# Patient Record
Sex: Male | Born: 1965 | Race: Black or African American | Hispanic: No | Marital: Single | Smoking: Current every day smoker
Health system: Southern US, Community
[De-identification: ages and names within clinical notes are randomized; demographics above are authoritative.]

## PROBLEM LIST (undated history)

## (undated) ENCOUNTER — Emergency Department: Admission: EM | Payer: Medicaid Other | Source: Home / Self Care

## (undated) ENCOUNTER — Emergency Department: Payer: Medicaid Other

## (undated) DIAGNOSIS — F1010 Alcohol abuse, uncomplicated: Secondary | ICD-10-CM

## (undated) DIAGNOSIS — E119 Type 2 diabetes mellitus without complications: Secondary | ICD-10-CM

## (undated) DIAGNOSIS — K8020 Calculus of gallbladder without cholecystitis without obstruction: Secondary | ICD-10-CM

## (undated) DIAGNOSIS — Z720 Tobacco use: Secondary | ICD-10-CM

## (undated) DIAGNOSIS — K3532 Acute appendicitis with perforation and localized peritonitis, without abscess: Secondary | ICD-10-CM

## (undated) HISTORY — DX: Alcohol abuse, uncomplicated: F10.10

## (undated) HISTORY — PX: APPENDECTOMY: SHX54

## (undated) HISTORY — DX: Calculus of gallbladder without cholecystitis without obstruction: K80.20

## (undated) HISTORY — DX: Acute appendicitis with perforation, localized peritonitis, and gangrene, without abscess: K35.32

## (undated) HISTORY — PX: CHOLECYSTECTOMY: SHX55

## (undated) HISTORY — DX: Tobacco use: Z72.0

---

## 2004-01-07 ENCOUNTER — Inpatient Hospital Stay: Payer: Self-pay | Admitting: Surgery

## 2006-01-12 ENCOUNTER — Emergency Department: Payer: Self-pay | Admitting: Emergency Medicine

## 2006-03-04 ENCOUNTER — Emergency Department (HOSPITAL_COMMUNITY): Admission: EM | Admit: 2006-03-04 | Discharge: 2006-03-04 | Payer: Self-pay | Admitting: Emergency Medicine

## 2006-03-19 ENCOUNTER — Emergency Department: Payer: Self-pay | Admitting: Emergency Medicine

## 2006-07-22 ENCOUNTER — Emergency Department: Payer: Self-pay

## 2006-12-12 ENCOUNTER — Emergency Department: Payer: Self-pay | Admitting: Emergency Medicine

## 2006-12-12 ENCOUNTER — Other Ambulatory Visit: Payer: Self-pay

## 2006-12-15 ENCOUNTER — Emergency Department: Payer: Self-pay | Admitting: Emergency Medicine

## 2008-07-23 ENCOUNTER — Emergency Department: Payer: Self-pay | Admitting: Emergency Medicine

## 2008-07-30 ENCOUNTER — Ambulatory Visit: Payer: Self-pay | Admitting: General Surgery

## 2008-08-07 ENCOUNTER — Ambulatory Visit: Payer: Self-pay | Admitting: General Surgery

## 2008-08-12 ENCOUNTER — Emergency Department: Payer: Self-pay | Admitting: Emergency Medicine

## 2009-01-22 ENCOUNTER — Ambulatory Visit: Payer: Self-pay

## 2009-05-05 ENCOUNTER — Encounter: Payer: Self-pay | Admitting: Internal Medicine

## 2009-05-05 ENCOUNTER — Emergency Department: Payer: Self-pay | Admitting: Emergency Medicine

## 2009-05-05 LAB — CONVERTED CEMR LAB
BUN: 11 mg/dL
CO2: 28 meq/L
Calcium: 9.4 mg/dL
Chloride: 102 meq/L
Sodium: 138 meq/L

## 2009-05-06 ENCOUNTER — Ambulatory Visit: Payer: Self-pay | Admitting: Internal Medicine

## 2009-05-06 DIAGNOSIS — R079 Chest pain, unspecified: Secondary | ICD-10-CM

## 2009-05-06 DIAGNOSIS — R0602 Shortness of breath: Secondary | ICD-10-CM | POA: Insufficient documentation

## 2009-05-06 DIAGNOSIS — F17200 Nicotine dependence, unspecified, uncomplicated: Secondary | ICD-10-CM | POA: Insufficient documentation

## 2009-05-06 DIAGNOSIS — F1010 Alcohol abuse, uncomplicated: Secondary | ICD-10-CM | POA: Insufficient documentation

## 2009-05-24 ENCOUNTER — Encounter: Payer: Self-pay | Admitting: Internal Medicine

## 2009-05-24 ENCOUNTER — Ambulatory Visit: Payer: Self-pay | Admitting: Cardiovascular Disease

## 2009-05-25 ENCOUNTER — Encounter: Payer: Self-pay | Admitting: Internal Medicine

## 2009-05-25 LAB — CONVERTED CEMR LAB
BUN: 13 mg/dL (ref 6–23)
Calcium: 9.8 mg/dL (ref 8.4–10.5)
Creatinine, Ser: 1.17 mg/dL (ref 0.40–1.50)
Glucose, Bld: 152 mg/dL — ABNORMAL HIGH (ref 70–99)
HCT: 46.4 % (ref 39.0–52.0)
MCV: 102 fL — ABNORMAL HIGH (ref 78.0–100.0)
Platelets: 265 10*3/uL (ref 150–400)
Potassium: 4.1 meq/L (ref 3.5–5.3)
Prothrombin Time: 13 s (ref 11.6–15.2)
RDW: 13.1 % (ref 11.5–15.5)
WBC: 11.6 10*3/uL — ABNORMAL HIGH (ref 4.0–10.5)

## 2009-05-28 ENCOUNTER — Ambulatory Visit: Payer: Self-pay | Admitting: Internal Medicine

## 2009-05-28 ENCOUNTER — Inpatient Hospital Stay (HOSPITAL_BASED_OUTPATIENT_CLINIC_OR_DEPARTMENT_OTHER): Admission: RE | Admit: 2009-05-28 | Discharge: 2009-05-28 | Payer: Self-pay | Admitting: Internal Medicine

## 2009-06-21 ENCOUNTER — Ambulatory Visit: Payer: Self-pay | Admitting: Cardiovascular Disease

## 2010-05-10 NOTE — Assessment & Plan Note (Signed)
Summary: NEW PT; CHEST PAIN   Visit Type:  New Patient Referring Provider:  [facility] ED Primary Provider:  Bernestine Amass PCP  CC:  chest pains, sob, and no edema.  History of Present Illness: 45 y/o male smoker with h/o chronic LBP. Denies h/o HTN, HL or DM2. Referred  by [facility] for further evaluation of CP.  Had pre-op treadmill myoview prior to cholecystectomy in April 2010. Has never had cath.   Works in his yard (as back permits) and occasionally gets some mild CP and SOB. Over past month has been having CP radiating to his arm. Occurs almost every day. Lasts 30 seconds. Sits down and feels better.   Went to [facility] for evaluation prior to tooth extraction yesterday. Told them about CP and they referred to ER. In ER, CP resolved. ECG and cardiac markers normal. Referred for f/u.   Preventive Screening-Counseling & Management  Alcohol-Tobacco     Alcohol drinks/day: >5     Alcohol type: beer     Smoking Status: current     Packs/Day: 1.0  Caffeine-Diet-Exercise     Caffeine use/day: no      Does Patient Exercise: no      Drug Use:  yes.    Current Medications (verified): 1)  Vicodin 5-500 Mg Tabs (Hydrocodone-Acetaminophen) .Marland Kitchen.. 1 By Mouth Every 6 Hours For Pain 2)  Ibuprofen 800 Mg Tabs (Ibuprofen) .Marland Kitchen.. 1 By Mouth Eery 6 Hours As Needed  Allergies (verified): No Known Drug Allergies  Past History:  Family History: Last updated: 15-May-2009 Father: deceased  Mother: deceased heart problems, HTN, DM Maternal Grandmother: deceased: MI, HTN  Social History: Last updated: 2009-05-15 Single . On disability Tobacco Use - Yes.  1ppd Alcohol Use - yes 6-pack or more per day Regular Exercise - no Drug Use - h/o cocaine and marijuana. no cocaine recently  Risk Factors: Alcohol Use: >5 (05/15/09) Caffeine Use: no  (15-May-2009) Exercise: no (05/15/09)  Risk Factors: Smoking Status: current (May 15, 2009) Packs/Day: 1.0 (2009-05-15)  Past Medical  History: Gallstones s/p cholecystetomy    --pre-op ETT/Myoview s/p Ruptured appendix Tobacco use ETOH abuse  Past Surgical History: gallbladder removed appendectomy  Family History: Reviewed history and no changes required. Father: deceased  Mother: deceased heart problems, HTN, DM Maternal Grandmother: deceased: MI, HTN  Social History: Reviewed history and no changes required. Single . On disability Tobacco Use - Yes.  1ppd Alcohol Use - yes 6-pack or more per day Regular Exercise - no Drug Use - h/o cocaine and marijuana. no cocaine recently Alcohol drinks/day:  >5 Smoking Status:  current Packs/Day:  1.0 Caffeine use/day:  no  Does Patient Exercise:  no Drug Use:  yes  Review of Systems       As per HPI and past medical history; otherwise all systems negative.    Vital Signs:  Patient profile:   45 year old male Height:      69 inches Weight:      146.38 pounds BMI:     21.69 Pulse rate:   87 / minute Pulse rhythm:   regular BP sitting:   142 / 70  (left arm) Cuff size:   regular  Vitals Entered By: Mercer Pod (05/15/2009 2:45 PM)  Physical Exam  General:  Gen: well appearing. no resp difficulty HEENT: normal Neck: supple. no JVD. Carotids 2+ bilat; no bruits. No lymphadenopathy or thryomegaly appreciated. Cor: PMI nondisplaced. Regular rate & rhythm. No rubs, gallops, murmur. Lungs: clear Abdomen: soft, nontender,  nondistended. No hepatosplenomegaly. No bruits or masses. Good bowel sounds. Extremities: no cyanosis, clubbing, rash,  edema Neuro: alert & orientedx3, cranial nerves grossly intact. moves all 4 extremities w/o difficulty. affect pleasant    Impression & Recommendations:  Problem # 1:  CHEST PAIN UNSPECIFIED (ICD-786.50) Symptoms are both typical and atypical. No objective signs ischemia at this point. I discussed options of cath vs stress test with both him and his uncle (by phone). They will discuss tonight and get back  to [place]. I think may also hav a component of GERD and we will start zatnac 150 two times a day.  Problem # 2:  TOBACCO ABUSE (ICD-305.1) Counseled on need to stop smoking  and drinking ETOH.  Problem # 3:  ALCOHOL ABUSE (ICD-305.00) Counseled on need to quit.  Patient Instructions: 1)  Your physician has recommended you make the following change in your medication: zantac 150mg  twice a day 2)  Your physician recommends that you schedule a follow-up appointment in: 3 months  Prescriptions: ZANTAC 150 MG CAPS (RANITIDINE HCL) 1 tab by mouth twice daily  #30 x 3   Entered by:   Charlena Cross, RN, BSN   Authorized by:   Dolores Patty, MD, [facility]   Signed by:   Charlena Cross, RN, BSN on 05/06/2009   Method used:   Electronically to        [facility] [address] (retail)       [address]       [place], [place]  [address]       Ph: [phone]       Fax: [phone]   RxID:   [id number]   Appended Document: NEW PT; CHEST PAIN    Clinical Lists Changes  Orders: Added new Referral order of Cardiac Catheterization (Cardiac Cath) - Signed

## 2010-05-10 NOTE — Assessment & Plan Note (Signed)
Summary: ROV   Visit Type:   post hospital Referring Provider:  [facility] ED Primary Provider:  Bernestine Amass PCP  CC:  chest pains still little ones and little shortness of breath; no edema in ankles and feet!.  History of Present Illness: 45 y/o male smoker with h/o chronic LBP. Denies h/o HTN, HL or DM2. Referred  by [facility] for further evaluation of CP.   cardiac catheterization was performed by Dr. Gala Romney on May 28, 1998 and it showed no significant coronary artery disease, low normal systolic function. It was felt that his chest pain was due to noncardiac cause. He has been counseled on his need to stop smoking. Nonfasting glucose of 152 and he does have a family history of diabetes.  He does report having occasional episodes of chest discomfort and burning. He has not been taking any medicines for heartburn. Otherwise he feels well and is active with no symptoms.  Current Problems (verified): 1)  Alcohol Abuse  (ICD-305.00) 2)  Tobacco Abuse  (ICD-305.1) 3)  Shortness of Breath  (ICD-786.05) 4)  Chest Pain Unspecified  (ICD-786.50)  Current Medications (verified): 1)  Vicodin 5-500 Mg Tabs (Hydrocodone-Acetaminophen) .Marland Kitchen.. 1 By Mouth Every 6 Hours For Pain 2)  Ibuprofen 800 Mg Tabs (Ibuprofen) .Marland Kitchen.. 1 By Mouth Eery 6 Hours As Needed  Allergies (verified): No Known Drug Allergies  Past History:  Past Medical History: Last updated: 05/11/09 Gallstones s/p cholecystetomy    --pre-op ETT/Myoview s/p Ruptured appendix Tobacco use ETOH abuse  Past Surgical History: Last updated: 05/11/2009 gallbladder removed appendectomy  Family History: Last updated: May 11, 2009 Father: deceased  Mother: deceased heart problems, HTN, DM Maternal Grandmother: deceased: MI, HTN  Social History: Last updated: 05-11-09 Single . On disability Tobacco Use - Yes.  1ppd Alcohol Use - yes 6-pack or more per day Regular Exercise - no Drug Use - h/o cocaine and marijuana. no  cocaine recently  Risk Factors: Alcohol Use: >5 (05-11-09) Caffeine Use: no  (05-11-2009) Exercise: no (2009/05/11)  Risk Factors: Smoking Status: current (May 11, 2009) Packs/Day: 1.0 (2009-05-11)  Review of Systems       The patient complains of chest pain.  The patient denies fever, vision loss, decreased hearing, hoarseness, syncope, dyspnea on exertion, peripheral edema, prolonged cough, abdominal pain, incontinence, muscle weakness, depression, and enlarged lymph nodes.    Vital Signs:  Patient profile:   45 year old male Height:      69 inches Weight:      209.50 pounds BMI:     31.05 Pulse rate:   80 / minute Pulse rhythm:   regular BP sitting:   120 / 86  (left arm) Cuff size:   large  Vitals Entered By: Mercer Pod (June 21, 2009 10:41 AM)  Physical Exam  General:  well-appearing young male in no apparent distress, HEENT exam is benign, oropharynx is clear, neck is supple with no JVP or carotid bruits, heart sounds are regular with S1-S2 and no murmurs appreciated, lungs are clear to auscultation with no wheezes or rales, abdominal exam is benign, no significant lower extremity edema, neurologic exam is grossly nonfocal, skin is warm and dry.   Impression & Recommendations:  Problem # 1:  CHEST PAIN UNSPECIFIED (ICD-786.50) He continues to have occasional episodes of chest discomfort. Negative cardiac catheterization with no coronary disease. Chest pain is likely noncardiac. Have encouraged him to try a H2 blocker and antacids for his symptoms.  he currently does not have a primary care physician who had asked  him to try to find one. Is not currently on any medications.  Problem # 2:  TOBACCO ABUSE (ICD-305.1) long history of smoking one half to one pack per day. We have strongly encouraged him to talk about the complications from smoking and although he may not have known coronary disease, the progression is slow and insidious. He also talked about the  complications from COPD and peripheral vascular disease.  we also discussed his weight with him as he is at least 20 pounds overweight given his height. We have asked him to increase his exercise.

## 2010-05-10 NOTE — Progress Notes (Signed)
Summary: PHI  PHI   Imported By: Harlon Flor 05/07/2009 11:02:23  _____________________________________________________________________  External Attachment:    Type:   Image     Comment:   External Document

## 2010-05-10 NOTE — Letter (Signed)
Summary: Cardiac Catheterization Instructions- [facility]  [facility] [address]   [place], [place] [address]   Phone: [phone]  Fax: [phone]     05/24/2009 MRN: [id number]  Richard Chambers [address] [place], [place]  [address]  Dear Mr. Buelow,   You are scheduled for a Cardiac Catheterization on 05/28/09 with Dr. Gala Romney.   Please arrive to the 1st floor of the Heart and Vascular Center at [facility] at 8:45 am on the day of your procedure. Please do not arrive before 6:30 a.m. Call the Heart and Vascular Center at [phone] if you are unable to make your appointmnet. The Code to get into the parking garage under the building is 0900. Take the elevators to the 1st floor. You must have someone to drive you home. Someone must be with you for the first 24 hours after you arrive home. Please wear clothes that are easy to get on and off and wear slip-on shoes. Do not eat or drink after midnight except water with your medications that morning. Bring all your medications and current insurance cards with you.  ___ DO NOT take these medications before your procedure: ________________________________________________________________  ___ Make sure you take your aspirin.  ___ You may take ALL of your medications with water that morning. ________________________________________________________________________________________________________________________________  ___ DO NOT take ANY medications before your procedure.  ___ Pre-med instructions:  ________________________________________________________________________________________________________________________________  The usual length of stay after your procedure is 2 to 3 hours. This can vary.  If you have any questions, please call the office at the number listed above.   Charlena Cross, RN, BSN

## 2010-10-27 ENCOUNTER — Encounter: Payer: Self-pay | Admitting: Cardiovascular Disease

## 2011-01-30 ENCOUNTER — Emergency Department: Payer: Self-pay | Admitting: Emergency Medicine

## 2011-08-03 IMAGING — CR DG CHEST 2V
1 series · 2 of 2 positions shown · non-contrast
Comparison: none

REASON FOR EXAM: CP
COMMENTS:

PROCEDURE:     DXR - DXR CHEST PA (OR AP) AND LATERAL  - May 05, 2009 [DATE]
RESULT:     The lung fields are clear. The heart, mediastinal and osseous
structures show no significant abnormalities.

[Series 1: view not recorded · 0.17mm/px · 2 of 2 slices shown]
[im 1/2]
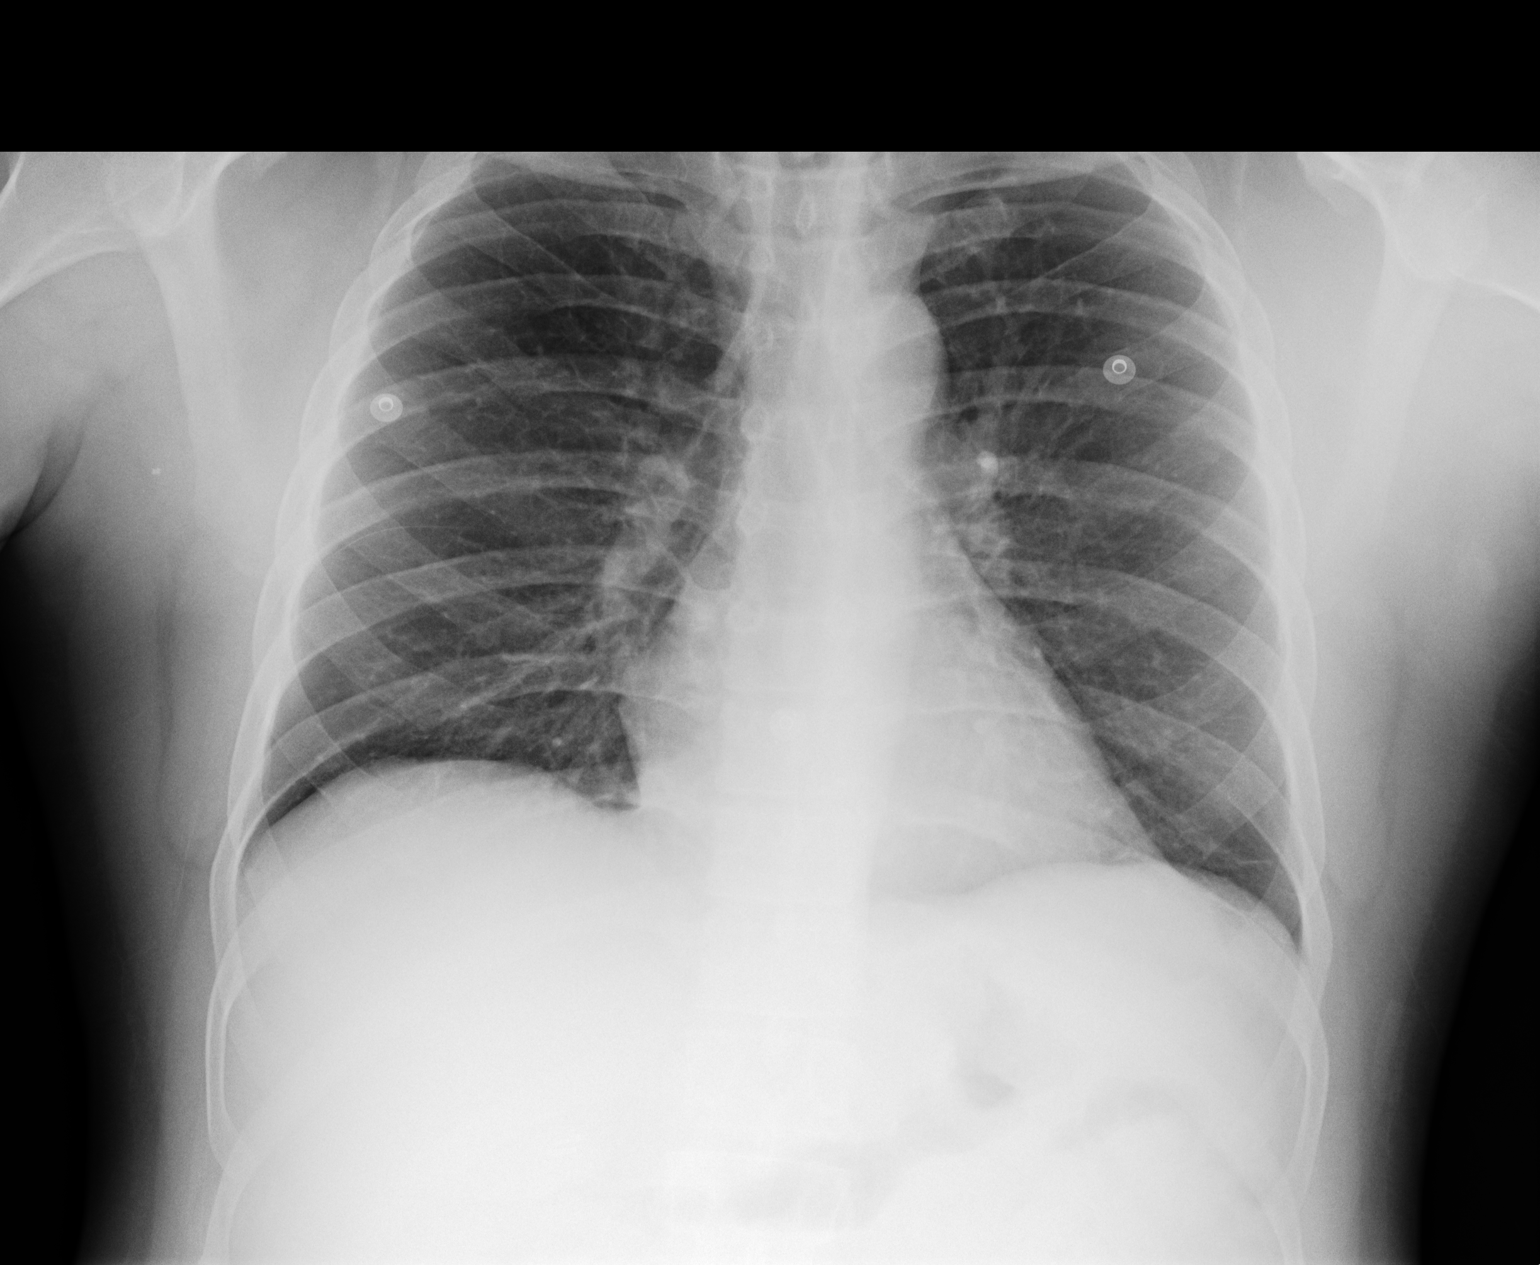
[im 2/2]
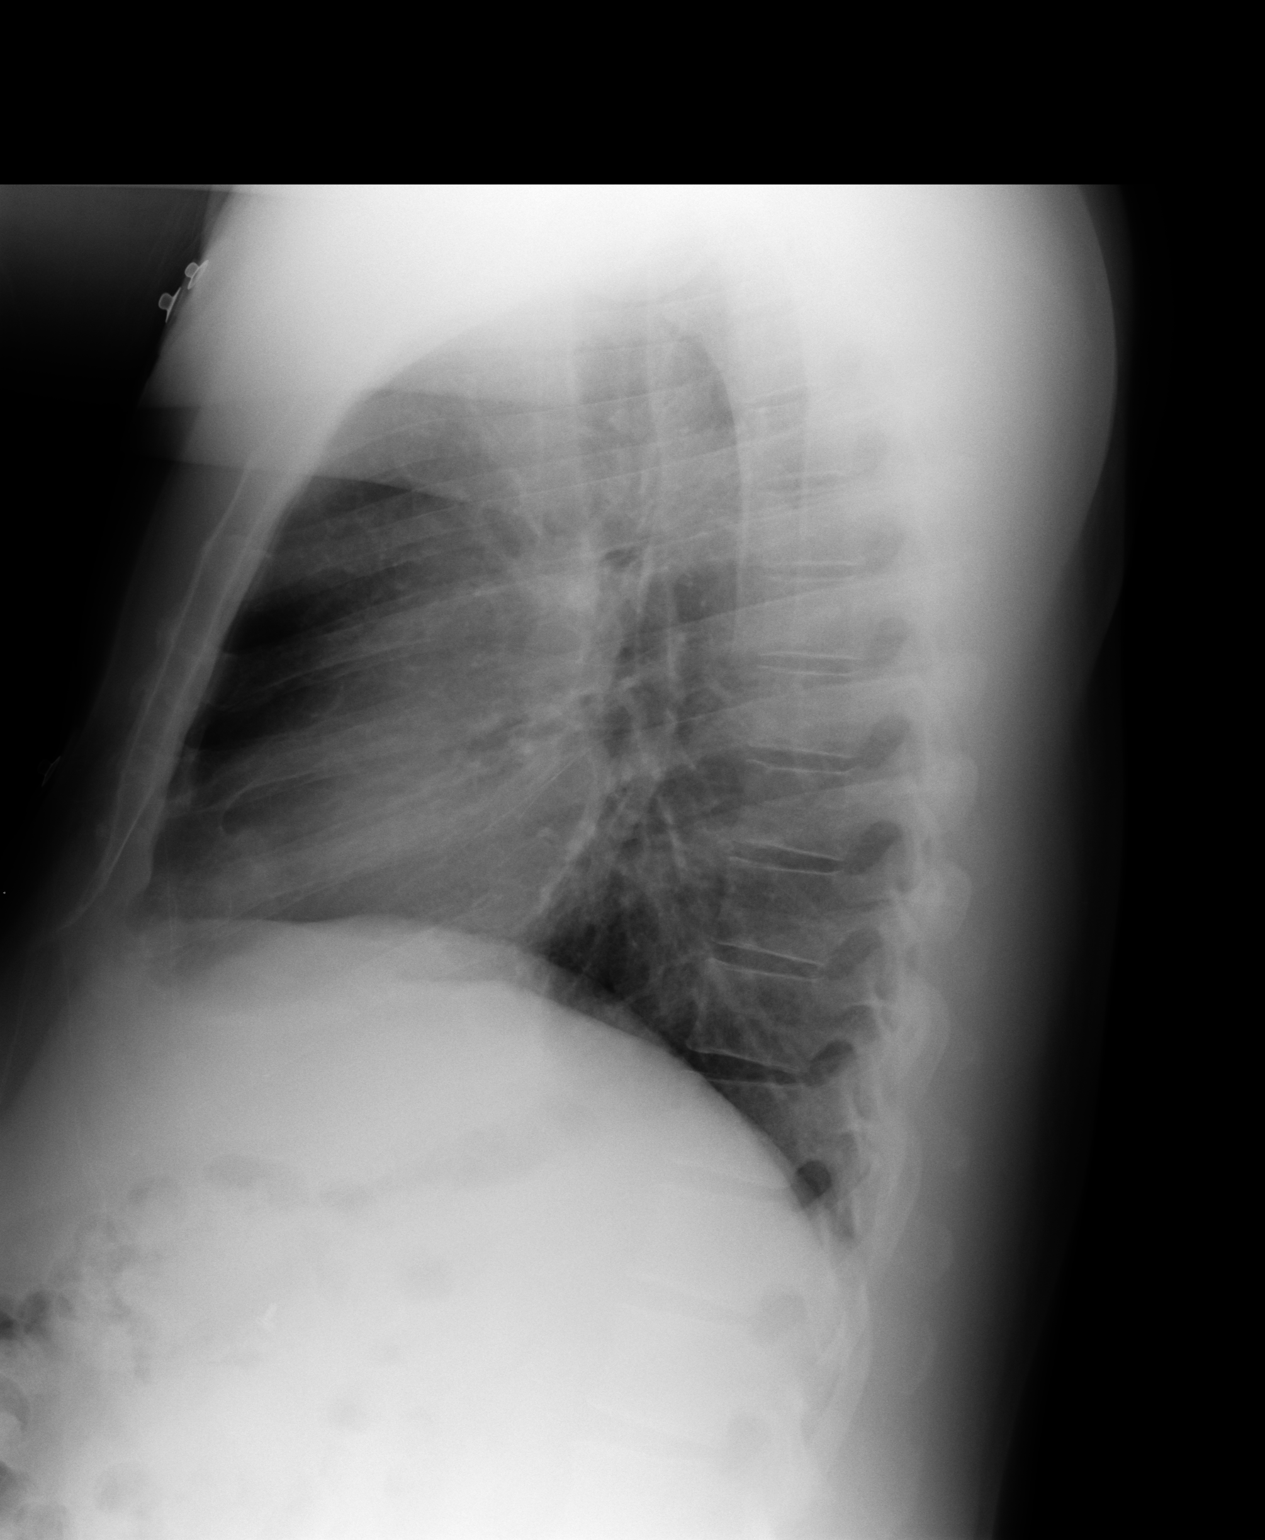

[2 of 2 positions shown; findings below may reference images not displayed]

IMPRESSION: 1.     No acute changes are identified.

## 2013-04-26 ENCOUNTER — Emergency Department: Payer: Self-pay | Admitting: Internal Medicine

## 2014-08-24 ENCOUNTER — Emergency Department
Admission: EM | Admit: 2014-08-24 | Discharge: 2014-08-24 | Disposition: A | Discharge status: Home / Self Care | Payer: Self-pay | Attending: Emergency Medicine | Admitting: Emergency Medicine

## 2014-08-24 ENCOUNTER — Encounter: Payer: Self-pay | Admitting: Emergency Medicine

## 2014-08-24 DIAGNOSIS — N3001 Acute cystitis with hematuria: Secondary | ICD-10-CM | POA: Insufficient documentation

## 2014-08-24 DIAGNOSIS — Z720 Tobacco use: Secondary | ICD-10-CM | POA: Insufficient documentation

## 2014-08-24 LAB — URINALYSIS COMPLETE WITH MICROSCOPIC (ARMC ONLY)
BILIRUBIN URINE: NEGATIVE
GLUCOSE, UA: NEGATIVE mg/dL
KETONES UR: NEGATIVE mg/dL
NITRITE: NEGATIVE
Protein, ur: 100 mg/dL — AB
SPECIFIC GRAVITY, URINE: 1.024 (ref 1.005–1.030)
pH: 6 (ref 5.0–8.0)

## 2014-08-24 LAB — CHLAMYDIA/NGC RT PCR (ARMC ONLY)
Chlamydia Tr: NOT DETECTED
N GONORRHOEAE: NOT DETECTED

## 2014-08-24 MED ORDER — CEFTRIAXONE SODIUM 1 G IJ SOLR
500.0000 mg | Freq: Once | INTRAMUSCULAR | Status: AC
  Administered 2014-08-24: 500 mg via INTRAMUSCULAR

## 2014-08-24 MED ORDER — CIPROFLOXACIN HCL 500 MG PO TABS: 500.0000 mg | ORAL_TABLET | Freq: Two times a day (BID) | ORAL | Status: AC

## 2014-08-24 MED ORDER — CEFTRIAXONE SODIUM 1 G IJ SOLR
INTRAMUSCULAR | Status: AC
  Administered 2014-08-24: 500 mg via INTRAMUSCULAR
  Filled 2014-08-24: qty 10

## 2014-08-24 MED ORDER — LIDOCAINE HCL (PF) 1 % IJ SOLN
INTRAMUSCULAR | Status: AC
  Filled 2014-08-24: qty 5

## 2014-08-24 NOTE — ED Notes (Signed)
Patient has had increased urination since Saturday.  Denies any pain except at end of urination.

## 2014-08-24 NOTE — ED Provider Notes (Signed)
[facility] Emergency Department Provider Note  ____________________________________________  Time seen: Approximately 1441 PM  I have reviewed the triage vital signs and the nursing notes.   HISTORY  Chief Complaint Urinary Frequency   HPI Richard Chambers is a 49 y.o. male for the past 2 days. He denies previous UTIs. He denies known STD exposure. He does complain of suprapubic tenderness. He denies CVA tenderness.   Past Medical History  Diagnosis Date  . Gallstones     s/p cholecystectomy, pre-op ETT/Myoview  . Ruptured appendix   . Tobacco user   . Alcohol abuse     Patient Active Problem List   Diagnosis Date Noted  . ALCOHOL ABUSE 05/06/2009  . TOBACCO ABUSE 05/06/2009  . SHORTNESS OF BREATH 05/06/2009  . CHEST PAIN UNSPECIFIED 05/06/2009    Past Surgical History  Procedure Laterality Date  . Cholecystectomy    . Appendectomy      Current Outpatient Rx  Name  Route  Sig  Dispense  Refill  . ciprofloxacin (CIPRO) 500 MG tablet   Oral   Take 1 tablet (500 mg total) by mouth 2 (two) times daily.   10 tablet   0   . HYDROcodone-acetaminophen (VICODIN) 5-500 MG per tablet   Oral   Take 1 tablet by mouth every 6 (six) hours as needed.           Marland Kitchen. ibuprofen (ADVIL,MOTRIN) 800 MG tablet   Oral   Take 800 mg by mouth every 6 (six) hours as needed.             Allergies Review of patient's allergies indicates no known allergies.  Family History  Problem Relation Age of Onset  . Hypertension Mother   . Diabetes Mother   . Heart attack Maternal Grandmother   . Hypertension Maternal Grandmother     Social History History  Substance Use Topics  . Smoking status: Current Every Day Smoker -- 1.00 packs/day  . Smokeless tobacco: Not on file  . Alcohol Use: Yes     Comment: 6-pack or more per day    Review of Systems Constitutional: No fever/chills Eyes: No visual changes. ENT: No sore throat. Cardiovascular: Denies  chest pain. Respiratory: Denies shortness of breath. Gastrointestinal: Positive for abdominal pain.  No nausea, no vomiting.  No diarrhea.  No constipation. Genitourinary: Positive for dysuria. Musculoskeletal: Negative for back pain. Skin: Negative for rash. Neurological: Negative for headaches, focal weakness or numbness.  10-point ROS otherwise negative.  ____________________________________________   PHYSICAL EXAM:  VITAL SIGNS: ED Triage Vitals  Enc Vitals Group     BP 08/24/14 1413 135/75 mmHg     Pulse Rate 08/24/14 1413 81     Resp 08/24/14 1413 16     Temp 08/24/14 1413 98.1 F (36.7 C)     Temp Source 08/24/14 1413 Oral     SpO2 08/24/14 1413 99 %     Weight 08/24/14 1413 193 lb (87.544 kg)     Height 08/24/14 1413 5\' 9"  (1.753 m)     Head Cir --      Peak Flow --      Pain Score --      Pain Loc --      Pain Edu? --      Excl. in GC? --     Constitutional: Alert and oriented. Well appearing and in no acute distress. Eyes: Conjunctivae are normal. PERRL. EOMI. Head: Atraumatic. Nose: No congestion/rhinnorhea. Mouth/Throat: Mucous membranes are moist.  Neck: No stridor.   Cardiovascular: Normal rate, regular rhythm.   Good peripheral circulation. Respiratory: Normal respiratory effort.  Gastrointestinal: Soft and mildly tender over suprapubic area. No distention. No abdominal bruits. No CVA tenderness. Genitourinary: Normal visual exam without penile drainage. Musculoskeletal: No lower extremity tenderness nor edema.  Neurologic:  Normal speech and language. No gross focal neurologic deficits are appreciated. Speech is normal. No gait instability. Skin:  Skin is warm, dry and intact. No rash noted. Psychiatric: Mood and affect are normal. Speech and behavior are normal.  ____________________________________________   LABS (all labs ordered are listed, but only abnormal results are displayed)  Labs Reviewed  URINALYSIS COMPLETEWITH MICROSCOPIC ([facility])   - Abnormal; Notable for the following:    Color, Urine YELLOW (*)    APPearance CLOUDY (*)    Hgb urine dipstick 3+ (*)    Protein, ur 100 (*)    Leukocytes, UA 2+ (*)    Bacteria, UA RARE (*)    Squamous Epithelial / LPF 0-5 (*)    All other components within normal limits  CHLAMYDIA/NGC RT PCR ([facility])   WBC too numerous to count. ____________________________________________  EKG   ____________________________________________  RADIOLOGY   ____________________________________________   PROCEDURES  Procedure(s) performed: None  Critical Care performed: No  ____________________________________________   INITIAL IMPRESSION / ASSESSMENT AND PLAN / ED COURSE  Pertinent labs & imaging results that were available during my care of the patient were reviewed by me and considered in my medical decision making (see chart for details).  GC chlamydia urine test still pending. Rocephin 500 mg IM ordered in the emergency department. Patient was advised he would need to follow up with primary care or urology for symptoms that are not improving with antibiotics. He was also advised if his urine is tested positive for chlamydia or gonorrhea that his partner would need to be tested and treated as well. ____________________________________________   FINAL CLINICAL IMPRESSION(S) / ED DIAGNOSES  Final diagnoses:  Acute cystitis with hematuria     Chinita PesterCari B Nekisha Mcdiarmid, FNP 08/24/14 1630  Sharman CheekPhillip Stafford, MD 08/24/14 2211

## 2015-07-18 ENCOUNTER — Emergency Department
Admission: EM | Admit: 2015-07-18 | Discharge: 2015-07-18 | Disposition: A | Discharge status: Home / Self Care | Payer: Self-pay | Attending: Emergency Medicine | Admitting: Emergency Medicine

## 2015-07-18 ENCOUNTER — Encounter: Payer: Self-pay | Admitting: Emergency Medicine

## 2015-07-18 DIAGNOSIS — E118 Type 2 diabetes mellitus with unspecified complications: Secondary | ICD-10-CM | POA: Insufficient documentation

## 2015-07-18 DIAGNOSIS — F17200 Nicotine dependence, unspecified, uncomplicated: Secondary | ICD-10-CM | POA: Insufficient documentation

## 2015-07-18 LAB — URINALYSIS COMPLETE WITH MICROSCOPIC (ARMC ONLY)
Bilirubin Urine: NEGATIVE
Glucose, UA: >500 mg/dL — AB
HGB URINE DIPSTICK: NEGATIVE
LEUKOCYTES UA: NEGATIVE
NITRITE: NEGATIVE
PROTEIN: 30 mg/dL — AB
SPECIFIC GRAVITY, URINE: 1.03 (ref 1.005–1.030)
Squamous Epithelial / LPF: NONE SEEN
Trans Epithel, UA: <1
pH: 6 (ref 5.0–8.0)

## 2015-07-18 LAB — CBC
HCT: 49.1 % (ref 40.0–52.0)
HEMOGLOBIN: 16.6 g/dL (ref 13.0–18.0)
MCH: 32.4 pg (ref 26.0–34.0)
MCHC: 33.9 g/dL (ref 32.0–36.0)
MCV: 95.6 fL (ref 80.0–100.0)
Platelets: 281 10*3/uL (ref 150–440)
RBC: 5.14 MIL/uL (ref 4.40–5.90)
RDW: 12.4 % (ref 11.5–14.5)
WBC: 11.9 10*3/uL — AB (ref 3.8–10.6)

## 2015-07-18 LAB — BASIC METABOLIC PANEL
Anion gap: 14 (ref 5–15)
BUN: 18 mg/dL (ref 6–20)
CO2: 24 mmol/L (ref 22–32)
Calcium: 9.6 mg/dL (ref 8.9–10.3)
Chloride: 90 mmol/L — ABNORMAL LOW (ref 101–111)
Creatinine, Ser: 1.25 mg/dL — ABNORMAL HIGH (ref 0.61–1.24)
GLUCOSE: 511 mg/dL — AB (ref 65–99)
POTASSIUM: 4.9 mmol/L (ref 3.5–5.1)
Sodium: 128 mmol/L — ABNORMAL LOW (ref 135–145)

## 2015-07-18 LAB — GLUCOSE, CAPILLARY
GLUCOSE-CAPILLARY: 461 mg/dL — AB (ref 65–99)
GLUCOSE-CAPILLARY: 369 mg/dL — AB (ref 65–99)

## 2015-07-18 MED ORDER — SODIUM CHLORIDE 0.9 % IV SOLN
Freq: Once | INTRAVENOUS | Status: AC
  Administered 2015-07-18: 15:00:00 via INTRAVENOUS

## 2015-07-18 MED ORDER — SODIUM CHLORIDE 0.9 % IV SOLN: Freq: Once | INTRAVENOUS | Status: DC

## 2015-07-18 MED ORDER — METFORMIN HCL 500 MG PO TABS: 500.0000 mg | ORAL_TABLET | Freq: Two times a day (BID) | ORAL | Status: DC

## 2015-07-18 MED ORDER — METFORMIN HCL 500 MG PO TABS
500.0000 mg | ORAL_TABLET | Freq: Once | ORAL | Status: AC
  Administered 2015-07-18: 500 mg via ORAL
  Filled 2015-07-18 (×2): qty 1

## 2015-07-18 MED ORDER — SODIUM CHLORIDE 0.9 % IV SOLN
Freq: Once | INTRAVENOUS | Status: AC
  Administered 2015-07-18: 14:00:00 via INTRAVENOUS

## 2015-07-18 NOTE — ED Notes (Signed)
Dr. Cyril LoosenKinner notified of blood glucose 511.

## 2015-07-18 NOTE — Discharge Instructions (Signed)

## 2015-07-18 NOTE — ED Notes (Signed)
New onset hyperglycemia bs 461 in triage with no history of dm. Excess thirst, frequent urination and blurry vision x 1 month

## 2015-07-18 NOTE — ED Notes (Signed)
Patient given resources for community clinics to follow up with. Also given information on prescription drug assistance.

## 2015-07-18 NOTE — ED Provider Notes (Signed)
[facility] Emergency Department Provider Note     Time seen: ----------------------------------------- 2:56 PM on 07/18/2015 -----------------------------------------    I have reviewed the triage vital signs and the nursing notes.   HISTORY  Chief Complaint Polydipsia    HPI Richard Chambers is a 50 y.o. male who presents to ER for excessive thirst, frequent urination, weight loss and blurry vision for the last month. Patient states most the people in his family are diabetic and he was encouraged to come here by his family. He does complaint of weakness currently and thirst, denies any other complaints. He denies any recent illness.   Past Medical History  Diagnosis Date  . Gallstones     s/p cholecystectomy, pre-op ETT/Myoview  . Ruptured appendix   . Tobacco user   . Alcohol abuse     Patient Active Problem List   Diagnosis Date Noted  . ALCOHOL ABUSE 05/06/2009  . TOBACCO ABUSE 05/06/2009  . SHORTNESS OF BREATH 05/06/2009  . CHEST PAIN UNSPECIFIED 05/06/2009    Past Surgical History  Procedure Laterality Date  . Cholecystectomy    . Appendectomy      Allergies Review of patient's allergies indicates no known allergies.  Social History Social History  Substance Use Topics  . Smoking status: Current Every Day Smoker -- 0.50 packs/day  . Smokeless tobacco: None  . Alcohol Use: Yes     Comment: 1-2 beers/ week    Review of Systems Constitutional: Negative for fever. Positive for increased thirst Eyes: Negative for visual changes. ENT: Negative for sore throat. Cardiovascular: Negative for chest pain. Respiratory: Negative for shortness of breath. Gastrointestinal: Negative for abdominal pain, vomiting and diarrhea. Genitourinary: Negative for dysuria. Musculoskeletal: Negative for back pain. Skin: Negative for rash. Neurological: Negative for headaches, positive for weakness  10-point ROS otherwise  negative.  ____________________________________________   PHYSICAL EXAM:  VITAL SIGNS: ED Triage Vitals  Enc Vitals Group     BP 07/18/15 1435 121/75 mmHg     Pulse Rate 07/18/15 1435 74     Resp 07/18/15 1435 18     Temp 07/18/15 1435 98.1 F (36.7 C)     Temp Source 07/18/15 1435 Oral     SpO2 07/18/15 1435 100 %     Weight 07/18/15 1435 177 lb (80.287 kg)     Height 07/18/15 1435  (1.753 m)     Head Cir --      Peak Flow --      Pain Score --      Pain Loc --      Pain Edu? --      Excl. in GC? --    Constitutional: Alert and oriented. Well appearing and in no distress. Eyes: Conjunctivae are normal. PERRL. Normal extraocular movements. ENT   Head: Normocephalic and atraumatic.   Nose: No congestion/rhinnorhea.   Mouth/Throat: Mucous membranes are moist.   Neck: No stridor. Cardiovascular: Normal rate, regular rhythm. Normal and symmetric distal pulses are present in all extremities. No murmurs, rubs, or gallops. Respiratory: Normal respiratory effort without tachypnea nor retractions. Breath sounds are clear and equal bilaterally. No wheezes/rales/rhonchi. Gastrointestinal: Soft and nontender. Normal bowel sounds Musculoskeletal: Nontender with normal range of motion in all extremities. No joint effusions.  No lower extremity tenderness nor edema. Neurologic:  Normal speech and language. No gross focal neurologic deficits are appreciated.  Skin:  Skin is warm, dry and intact. No rash noted. Psychiatric: Mood and affect are normal. Speech and behavior are  normal. Patient exhibits appropriate insight and judgment. ____________________________________________  ED COURSE:  Pertinent labs & imaging results that were available during my care of the patient were reviewed by me and considered in my medical decision making (see chart for details). Patient presents with one-month history of diabetes symptoms. We will check basic labs, give IV fluid and  reevaluate. ____________________________________________    LABS (pertinent positives/negatives)  Labs Reviewed  GLUCOSE, CAPILLARY - Abnormal; Notable for the following:    Glucose-Capillary 461 (*)    All other components within normal limits  BASIC METABOLIC PANEL - Abnormal; Notable for the following:    Sodium 128 (*)    Chloride 90 (*)    Glucose, Bld 511 (*)    Creatinine, Ser 1.25 (*)    All other components within normal limits  CBC - Abnormal; Notable for the following:    WBC 11.9 (*)    All other components within normal limits  URINALYSIS COMPLETEWITH MICROSCOPIC ([facility] ONLY)  CBG MONITORING, ED   ____________________________________________  FINAL ASSESSMENT AND PLAN  Type 2 diabetes  Plan: Patient with labs as dictated above. Patient is no acute distress, I started him on metformin, he's received 2 L of saline and his blood sugars improved. He'll be given a prescription for metformin to take at home and he is referred to general medicine for follow-up.   Emily FilbertWilliams, Steaven E, MD   Emily FilbertJonathan E Tyteanna Ost, MD 07/18/15 [phone]

## 2018-05-04 ENCOUNTER — Emergency Department
Admission: EM | Admit: 2018-05-04 | Discharge: 2018-05-04 | Disposition: A | Discharge status: Home / Self Care | Payer: Self-pay | Attending: Emergency Medicine | Admitting: Emergency Medicine

## 2018-05-04 ENCOUNTER — Emergency Department: Payer: Self-pay

## 2018-05-04 ENCOUNTER — Other Ambulatory Visit: Payer: Self-pay

## 2018-05-04 ENCOUNTER — Encounter: Payer: Self-pay | Admitting: *Deleted

## 2018-05-04 DIAGNOSIS — F17210 Nicotine dependence, cigarettes, uncomplicated: Secondary | ICD-10-CM | POA: Insufficient documentation

## 2018-05-04 DIAGNOSIS — R42 Dizziness and giddiness: Secondary | ICD-10-CM | POA: Insufficient documentation

## 2018-05-04 LAB — CBC
HCT: 45.2 % (ref 39.0–52.0)
Hemoglobin: 15.3 g/dL (ref 13.0–17.0)
MCH: 33 pg (ref 26.0–34.0)
MCHC: 33.8 g/dL (ref 30.0–36.0)
MCV: 97.4 fL (ref 80.0–100.0)
Platelets: 339 10*3/uL (ref 150–400)
RBC: 4.64 MIL/uL (ref 4.22–5.81)
RDW: 12.4 % (ref 11.5–15.5)
WBC: 10.3 10*3/uL (ref 4.0–10.5)
nRBC: 0 % (ref 0.0–0.2)

## 2018-05-04 LAB — BASIC METABOLIC PANEL
ANION GAP: 9 (ref 5–15)
BUN: 8 mg/dL (ref 6–20)
CO2: 24 mmol/L (ref 22–32)
Calcium: 9.1 mg/dL (ref 8.9–10.3)
Chloride: 105 mmol/L (ref 98–111)
Creatinine, Ser: 0.94 mg/dL (ref 0.61–1.24)
GFR calc Af Amer: >60 mL/min (ref >60)
GFR calc non Af Amer: >60 mL/min (ref >60)
Glucose, Bld: 181 mg/dL — ABNORMAL HIGH (ref 70–99)
Potassium: 3.2 mmol/L — ABNORMAL LOW (ref 3.5–5.1)
Sodium: 138 mmol/L (ref 135–145)

## 2018-05-04 LAB — GLUCOSE, CAPILLARY: Glucose-Capillary: 177 mg/dL — ABNORMAL HIGH (ref 70–99)

## 2018-05-04 LAB — TROPONIN I: Troponin I: <0.03 ng/mL (ref <0.03)

## 2018-05-04 MED ORDER — SODIUM CHLORIDE 0.9% FLUSH: 3.0000 mL | Freq: Once | INTRAVENOUS | Status: DC

## 2018-05-04 NOTE — ED Provider Notes (Signed)
[facility] Emergency Department Provider Note   ____________________________________________    I have reviewed the triage vital signs and the nursing notes.   HISTORY  Chief Complaint Hyperglycemia     HPI Richard Chambers is a 53 y.o. male who presents with complaints of elevated glucose.  Patient reports he felt slightly dizzy this morning and suspect this is related to his blood sugar being higher he reports compliance with his metformin.  He denies chest pain shortness of breath or palpitations.  No nausea or vomiting.  No fevers or chills.  Currently feels quite well has no dizziness.  Has not taken anything for this.   Past Medical History:  Diagnosis Date  . Alcohol abuse   . Gallstones    s/p cholecystectomy, pre-op ETT/Myoview  . Ruptured appendix   . Tobacco user     Patient Active Problem List   Diagnosis Date Noted  . ALCOHOL ABUSE 05/06/2009  . TOBACCO ABUSE 05/06/2009  . SHORTNESS OF BREATH 05/06/2009  . CHEST PAIN UNSPECIFIED 05/06/2009    Past Surgical History:  Procedure Laterality Date  . APPENDECTOMY    . CHOLECYSTECTOMY      Prior to Admission medications   Medication Sig Start Date End Date Taking? Authorizing Provider  HYDROcodone-acetaminophen (VICODIN) 5-500 MG per tablet Take 1 tablet by mouth every 6 (six) hours as needed.      [provider]  ibuprofen (ADVIL,MOTRIN) 800 MG tablet Take 800 mg by mouth every 6 (six) hours as needed.      [provider]  metFORMIN (GLUCOPHAGE) 500 MG tablet Take 1 tablet (500 mg total) by mouth 2 (two) times daily with a meal. 07/18/15 07/17/16  Emily Filbert, MD     Allergies Patient has no known allergies.  Family History  Problem Relation Age of Onset  . Hypertension Mother   . Diabetes Mother   . Heart attack Maternal Grandmother   . Hypertension Maternal Grandmother     Social History Social History   Tobacco Use  . Smoking status:  Current Every Day Smoker    Packs/day: 0.50  Substance Use Topics  . Alcohol use: Yes    Comment: 1-2 beers/ week  . Drug use: Yes    Types: Marijuana, Cocaine    Comment: h/o cocaine and marijuana, no cocaine recently    Review of Systems  Constitutional: No fever/chills Eyes: No visual changes.  ENT: No sore throat. Cardiovascular: Denies chest pain. Respiratory: Denies shortness of breath. Gastrointestinal: No abdominal pain.   Genitourinary: Negative for dysuria. Musculoskeletal: Negative for back pain. Skin: Negative for rash. Neurological: Negative for headaches or weakness   ____________________________________________   PHYSICAL EXAM:  VITAL SIGNS: ED Triage Vitals [05/04/18 0650]  Enc Vitals Group     BP 119/77     Pulse Rate 90     Resp 16     Temp 98.3 F (36.8 C)     Temp Source Oral     SpO2 97 %     Weight      Height      Head Circumference      Peak Flow      Pain Score 6     Pain Loc      Pain Edu?      Excl. in GC?     Constitutional: Alert and oriented. No acute distress. Pleasant and interactive Eyes: Conjunctivae are normal.  Head: Atraumatic. Nose: No congestion/rhinnorhea. Mouth/Throat: Mucous membranes are  moist.    Cardiovascular: Normal rate, regular rhythm. Grossly normal heart sounds.  Good peripheral circulation. Respiratory: Normal respiratory effort.  No retractions. Lungs CTAB. Gastrointestinal: Soft and nontender. No distention.  No CVA tenderness.  Musculoskeletal: No lower extremity tenderness nor edema.  Warm and well perfused Neurologic:  Normal speech and language. No gross focal neurologic deficits are appreciated.  Skin:  Skin is warm, dry and intact. No rash noted. Psychiatric: Mood and affect are normal. Speech and behavior are normal.  ____________________________________________   LABS (all labs ordered are listed, but only abnormal results are displayed)  Labs Reviewed  BASIC METABOLIC PANEL - Abnormal;  Notable for the following components:      Result Value   Potassium 3.2 (*)    Glucose, Bld 181 (*)    All other components within normal limits  GLUCOSE, CAPILLARY - Abnormal; Notable for the following components:   Glucose-Capillary 177 (*)    All other components within normal limits  CBC  TROPONIN I   ____________________________________________  EKG  ____________________________________________  RADIOLOGY  Chest x-ray unremarkable ____________________________________________   PROCEDURES  Procedure(s) performed: No  Procedures   Critical Care performed: No ____________________________________________   INITIAL IMPRESSION / ASSESSMENT AND PLAN / ED COURSE  Pertinent labs & imaging results that were available during my care of the patient were reviewed by me and considered in my medical decision making (see chart for details).  Patient presents with sensation of dizziness that started this morning, now resolved.  No lightheadedness palpitations shortness of breath or chest pain.  Currently feels quite well.  He is reassured to know that his glucose is 177.  Lab work is all quite reassuring.  Given that he is asymptomatic with unremarkable labs appropriate for discharge at this time.  Patient will follow-up with PCP.  Return precautions discussed    ____________________________________________   FINAL CLINICAL IMPRESSION(S) / ED DIAGNOSES  Final diagnoses:  Dizziness        Note:  This document was prepared using Dragon voice recognition software and may include unintentional dictation errors.   Jene Every, MD 05/04/18 1113

## 2018-05-04 NOTE — ED Triage Notes (Signed)
Pt says that he is feeling dizzy this morning and thinks his sugar is elevated. He did not take his diabetes medications yesterday. He has chest pain that has been intermittent starting about 2 hours ago. Mild shortness of breath. Has had a recent cough.

## 2018-05-04 NOTE — Discharge Instructions (Addendum)
Your test results today were reassuring, your chest x-ray was normal.  Please drink plenty of fluids and rest today, return to the ED if you feel your symptoms are worsening

## 2019-07-21 ENCOUNTER — Other Ambulatory Visit: Payer: Self-pay

## 2019-07-21 ENCOUNTER — Encounter (HOSPITAL_COMMUNITY): Payer: Self-pay | Admitting: Emergency Medicine

## 2019-07-21 ENCOUNTER — Emergency Department (HOSPITAL_COMMUNITY)
Admission: EM | Admit: 2019-07-21 | Discharge: 2019-07-21 | Disposition: A | Discharge status: Home / Self Care | Payer: Self-pay | Attending: Emergency Medicine | Admitting: Emergency Medicine

## 2019-07-21 DIAGNOSIS — F17200 Nicotine dependence, unspecified, uncomplicated: Secondary | ICD-10-CM | POA: Insufficient documentation

## 2019-07-21 DIAGNOSIS — E119 Type 2 diabetes mellitus without complications: Secondary | ICD-10-CM | POA: Insufficient documentation

## 2019-07-21 DIAGNOSIS — K0889 Other specified disorders of teeth and supporting structures: Secondary | ICD-10-CM | POA: Insufficient documentation

## 2019-07-21 LAB — CBG MONITORING, ED: Glucose-Capillary: 155 mg/dL — ABNORMAL HIGH (ref 70–99)

## 2019-07-21 MED ORDER — OXYCODONE-ACETAMINOPHEN 5-325 MG PO TABS
1.0000 | ORAL_TABLET | Freq: Once | ORAL | Status: AC
  Administered 2019-07-21: 1 via ORAL
  Filled 2019-07-21: qty 1

## 2019-07-21 MED ORDER — PENICILLIN V POTASSIUM 250 MG PO TABS
500.0000 mg | ORAL_TABLET | Freq: Once | ORAL | Status: AC
  Administered 2019-07-21: 05:00:00 500 mg via ORAL
  Filled 2019-07-21: qty 2

## 2019-07-21 MED ORDER — PENICILLIN V POTASSIUM 500 MG PO TABS: 500.0000 mg | ORAL_TABLET | Freq: Four times a day (QID) | ORAL | 0 refills | Status: AC

## 2019-07-21 MED ORDER — KETOROLAC TROMETHAMINE 30 MG/ML IJ SOLN
30.0000 mg | Freq: Once | INTRAMUSCULAR | Status: AC
  Administered 2019-07-21: 05:00:00 30 mg via INTRAMUSCULAR
  Filled 2019-07-21: qty 1

## 2019-07-21 MED ORDER — NAPROXEN 500 MG PO TABS: 500.0000 mg | ORAL_TABLET | Freq: Two times a day (BID) | ORAL | 0 refills | Status: DC | PRN

## 2019-07-21 NOTE — ED Triage Notes (Signed)
Pt presents with right upper dental pain that started 2 days ago. Pt denies fevers, N/V.

## 2019-07-21 NOTE — ED Provider Notes (Signed)
[facility] EMERGENCY DEPARTMENT Provider Note   CSN: [id number] Arrival date & time: 07/21/19  0315     History Chief Complaint  Patient presents with  . Dental Pain    Richard Chambers is a 54 y.o. male.  Patient with history of diabetes here with 2 days of right sided upper dental pain.  States he has pain "from decay".  Denies any acute trauma.  No bleeding or drainage.  No fevers, chills, nausea or vomiting.  He has been using over-the-counter medications including Goody powders, Tylenol and Motrin without relief.  Comes in tonight because the pain is especially severe.  Denies any difficulty breathing or difficulty swallowing.  No chest pain or shortness of breath.  He does not have a dentist.  The history is provided by the patient.  Dental Pain Associated symptoms: no fever and no headaches        Past Medical History:  Diagnosis Date  . Alcohol abuse   . Gallstones    s/p cholecystectomy, pre-op ETT/Myoview  . Ruptured appendix   . Tobacco user     Patient Active Problem List   Diagnosis Date Noted  . ALCOHOL ABUSE 05/06/2009  . TOBACCO ABUSE 05/06/2009  . SHORTNESS OF BREATH 05/06/2009  . CHEST PAIN UNSPECIFIED 05/06/2009    Past Surgical History:  Procedure Laterality Date  . APPENDECTOMY    . CHOLECYSTECTOMY         Family History  Problem Relation Age of Onset  . Hypertension Mother   . Diabetes Mother   . Heart attack Maternal Grandmother   . Hypertension Maternal Grandmother     Social History   Tobacco Use  . Smoking status: Current Every Day Smoker    Packs/day: 0.50  . Smokeless tobacco: Never Used  Substance Use Topics  . Alcohol use: Yes    Comment: 1-2 beers/ week  . Drug use: Yes    Types: Marijuana, Cocaine    Comment: h/o cocaine and marijuana, no cocaine recently    Home Medications Prior to Admission medications   Medication Sig Start Date End Date Taking? Authorizing Provider  HYDROcodone-acetaminophen (VICODIN)  5-500 MG per tablet Take 1 tablet by mouth every 6 (six) hours as needed.      [provider]  ibuprofen (ADVIL,MOTRIN) 800 MG tablet Take 800 mg by mouth every 6 (six) hours as needed.      [provider]  metFORMIN (GLUCOPHAGE) 500 MG tablet Take 1 tablet (500 mg total) by mouth 2 (two) times daily with a meal. 07/18/15 07/17/16  Earleen Newport, MD    Allergies    Patient has no known allergies.  Review of Systems   Review of Systems  Constitutional: Negative for activity change, appetite change and fever.  HENT: Positive for dental problem.   Respiratory: Negative for cough, chest tightness and shortness of breath.   Cardiovascular: Negative for chest pain.  Gastrointestinal: Negative for abdominal pain, nausea and vomiting.  Genitourinary: Negative for dysuria and hematuria.  Musculoskeletal: Negative for arthralgias and myalgias.  Skin: Negative for rash.  Neurological: Negative for dizziness, weakness and headaches.   all other systems are negative except as noted in the HPI and PMH.    Physical Exam Updated Vital Signs BP (!) 164/96 (BP Location: Right Arm)   Pulse 75   Temp 98 F (36.7 C) (Oral)   Resp 15   Ht 5\' 9"  (1.753 m)   Wt 89.4 kg   SpO2 97%  BMI 29.09 kg/m   Physical Exam Vitals and nursing note reviewed.  Constitutional:      General: He is not in acute distress.    Appearance: He is well-developed.     Comments: Patient was seen walking down the hall joking with a nurse in no distress.  When I enter the room he is on his knees against the bed moaning in pain  HENT:     Head: Normocephalic and atraumatic.     Mouth/Throat:     Pharynx: No oropharyngeal exudate.     Comments: Poor dentition throughout with multiple missing teeth.  Patient points to right upper incisor a source of pain to percussion.  There is a missing tooth next of this incisor. there is no appreciable abscess.  Floor mouth is soft. No trismus or  malocclusion Eyes:     Conjunctiva/sclera: Conjunctivae normal.     Pupils: Pupils are equal, round, and reactive to light.  Neck:     Comments: No meningismus. Cardiovascular:     Rate and Rhythm: Normal rate and regular rhythm.     Heart sounds: Normal heart sounds. No murmur.  Pulmonary:     Effort: Pulmonary effort is normal. No respiratory distress.     Breath sounds: Normal breath sounds.  Abdominal:     Palpations: Abdomen is soft.     Tenderness: There is no abdominal tenderness. There is no guarding or rebound.  Musculoskeletal:        General: No tenderness. Normal range of motion.     Cervical back: Normal range of motion and neck supple.  Skin:    General: Skin is warm.  Neurological:     Mental Status: He is alert and oriented to person, place, and time.     Cranial Nerves: No cranial nerve deficit.     Motor: No abnormal muscle tone.     Coordination: Coordination normal.     Comments: No ataxia on finger to nose bilaterally. No pronator drift. 5/5 strength throughout. CN 2-12 intact.Equal grip strength. Sensation intact.   Psychiatric:        Behavior: Behavior normal.     ED Results / Procedures / Treatments   Labs (all labs ordered are listed, but only abnormal results are displayed) Labs Reviewed  CBG MONITORING, ED - Abnormal; Notable for the following components:      Result Value   Glucose-Capillary 155 (*)    All other components within normal limits    EKG None  Radiology No results found.  Procedures Procedures (including critical care time)  Medications Ordered in ED Medications  oxyCODONE-acetaminophen (PERCOCET/ROXICET) 5-325 MG per tablet 1 tablet (has no administration in time range)  penicillin v potassium (VEETID) tablet 500 mg (has no administration in time range)  ketorolac (TORADOL) 30 MG/ML injection 30 mg (has no administration in time range)    ED Course  I have reviewed the triage vital signs and the nursing  notes.  Pertinent labs & imaging results that were available during my care of the patient were reviewed by me and considered in my medical decision making (see chart for details).    MDM Rules/Calculators/A&P                      Diabetic with dental pain.  No evidence of abscess or Ludwig's angina.  No fever, chills, nausea or vomiting.  No difficulty breathing or difficulty swallowing.  CBG is 155.  Patient feels improved after treatment in  the ED.  Advised needs to follow-up with a dentist to have this tooth pulled.  Will start antibiotics and pain control.  Dental resources given. No evidence of abscess or Ludwig angina currently.  Return to the ED with difficulty breathing, difficulty swallowing, worsening pain, swelling, fever, any concerns. Final Clinical Impression(s) / ED Diagnoses Final diagnoses:  Pain, dental    Rx / DC Orders ED Discharge Orders    None       Sakia Schrimpf, Jeannett Senior, MD 07/21/19 0530

## 2019-07-21 NOTE — Discharge Instructions (Signed)
Choose a dentist from the attached list for close follow-up.  Take the antibiotics as prescribed.  Return to the ED with worsening pain, difficulty breathing, difficulty swallowing, fever or any other concerns

## 2021-02-06 ENCOUNTER — Emergency Department
Admission: EM | Admit: 2021-02-06 | Discharge: 2021-02-06 | Disposition: A | Discharge status: Home / Self Care | Payer: Medicaid - Out of State | Attending: Emergency Medicine | Admitting: Emergency Medicine

## 2021-02-06 ENCOUNTER — Emergency Department: Payer: Medicaid - Out of State

## 2021-02-06 ENCOUNTER — Other Ambulatory Visit: Payer: Self-pay

## 2021-02-06 ENCOUNTER — Encounter: Payer: Self-pay | Admitting: Emergency Medicine

## 2021-02-06 DIAGNOSIS — F17210 Nicotine dependence, cigarettes, uncomplicated: Secondary | ICD-10-CM | POA: Diagnosis not present

## 2021-02-06 DIAGNOSIS — G8929 Other chronic pain: Secondary | ICD-10-CM | POA: Diagnosis not present

## 2021-02-06 DIAGNOSIS — M25561 Pain in right knee: Secondary | ICD-10-CM | POA: Insufficient documentation

## 2021-02-06 DIAGNOSIS — Y9222 Religious institution as the place of occurrence of the external cause: Secondary | ICD-10-CM | POA: Insufficient documentation

## 2021-02-06 DIAGNOSIS — M25551 Pain in right hip: Secondary | ICD-10-CM | POA: Insufficient documentation

## 2021-02-06 MED ORDER — ACETAMINOPHEN 500 MG PO TABS: 1000.0000 mg | ORAL_TABLET | Freq: Once | ORAL | Status: DC

## 2021-02-06 MED ORDER — KETOROLAC TROMETHAMINE 30 MG/ML IJ SOLN: 30.0000 mg | Freq: Once | INTRAMUSCULAR | Status: DC

## 2021-02-06 NOTE — ED Notes (Signed)
Patient declined discharge vital signs. E-signature not obtained. Computer is not functional in the room.

## 2021-02-06 NOTE — ED Triage Notes (Signed)
Pt via POV from home. Pt c/o R hip and knee pain which chronic but got worse today. Pt ambulatory to triage. Pt is A&Ox4 and NAD.

## 2021-02-06 NOTE — ED Provider Notes (Signed)
[facility] Emergency Department Provider Note ____________________________________________   Event Date/Time   First MD Initiated Contact with Patient 02/06/21 1517     (approximate)  I have reviewed the triage vital signs and the nursing notes.  HISTORY  Chief Complaint Hip Pain and Knee Pain   HPI Richard Chambers is a 55 y.o. malewho presents to the ED for evaluation of acute on chronic pain.   Chart review indicates no relevant history.  Patient presents to the ED for the evaluation of acute on chronic right hip pain.  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  He has been using ibuprofen "like candy, but it does not even phase me anymore."  Reports today at church, "my leg went one way and my hip went the other."  He says he did not fall or hit the ground, but reports his legs splayed out for unknown reason, causing acute on chronic pain to his right hip.  He has been ambulatory since the event, but reports antalgic gait.  Reports explicit concern for a fracture.  Further reports chronic right knee pain at its baseline.  Denies fevers, swollen joints, emesis, syncope, urinary retention or saddle anesthesias.  Denies acute back pain.  Past Medical History:  Diagnosis Date   Alcohol abuse    Gallstones    s/p cholecystectomy, pre-op ETT/Myoview   Ruptured appendix    Tobacco user     Patient Active Problem List   Diagnosis Date Noted   ALCOHOL ABUSE 05/06/2009   TOBACCO ABUSE 05/06/2009   SHORTNESS OF BREATH 05/06/2009   CHEST PAIN UNSPECIFIED 05/06/2009    Past Surgical History:  Procedure Laterality Date   APPENDECTOMY     CHOLECYSTECTOMY      Prior to Admission medications   Medication Sig Start Date End Date Taking? Authorizing Provider  HYDROcodone-acetaminophen (VICODIN) 5-500 MG  per tablet Take 1 tablet by mouth every 6 (six) hours as needed.      [provider]  ibuprofen (ADVIL,MOTRIN) 800 MG tablet Take 800 mg by mouth every 6 (six) hours as needed.      [provider]  metFORMIN (GLUCOPHAGE) 500 MG tablet Take 1 tablet (500 mg total) by mouth 2 (two) times daily with a meal. 07/18/15 07/17/16  Emily Filbert, MD  naproxen (NAPROSYN) 500 MG tablet Take 1 tablet (500 mg total) by mouth 2 (two) times daily as needed. 07/21/19   Glynn Octave, MD    Allergies Patient has no known allergies.  Family History  Problem Relation Age of Onset   Hypertension Mother    Diabetes Mother    Heart attack Maternal Grandmother    Hypertension Maternal Grandmother     Social History Social History   Tobacco Use   Smoking status: Every Day    Packs/day: 0.50    Types: Cigarettes   Smokeless tobacco: Never  Substance Use Topics   Alcohol use: Yes    Comment: 1-2 beers/ week   Drug use: Yes    Types: Marijuana, Cocaine    Comment: h/o cocaine and marijuana, no cocaine recently    Review of Systems  Constitutional: No fever/chills Eyes: No visual changes. ENT: No sore throat. Cardiovascular: Denies chest pain. Respiratory: Denies shortness of breath. Gastrointestinal: No abdominal pain.  No nausea, no vomiting.  No diarrhea.  No constipation. Genitourinary: Negative for  dysuria. Musculoskeletal: Negative for back pain. Positive for acute on chronic right hip pain, chronic right knee pain. Skin: Negative for rash. Neurological: Negative for headaches, focal weakness or numbness.  ____________________________________________   PHYSICAL EXAM:  VITAL SIGNS: Vitals:   02/06/21 1434  BP: (!) 154/92  Pulse: 79  Resp: 20  Temp: 98 F (36.7 C)  SpO2: 98%     Constitutional: Alert and oriented. Well appearing and in no acute distress. Eyes: Conjunctivae are normal. PERRL. EOMI. Head: Atraumatic. Nose: No  congestion/rhinnorhea. Mouth/Throat: Mucous membranes are moist.  Oropharynx non-erythematous. Neck: No stridor. No cervical spine tenderness to palpation. Cardiovascular: Normal rate, regular rhythm. Grossly normal heart sounds.  Good peripheral circulation. Respiratory: Normal respiratory effort.  No retractions. Lungs CTAB. Gastrointestinal: Soft , nondistended, nontender to palpation. No CVA tenderness. Musculoskeletal: No joint effusions. No signs of acute trauma.. Full active and passive ROM to the right leg with hip and knee flexion, causing discomfort though.  No overlying signs of trauma.  No joint effusions noted. Neurologic:  Normal speech and language. No gross focal neurologic deficits are appreciated. No gait instability noted. Skin:  Skin is warm, dry and intact. No rash noted. Psychiatric: Mood and affect are normal. Speech and behavior are normal.  ____________________________________________   LABS (all labs ordered are listed, but only abnormal results are displayed)  Labs Reviewed - No data to display ____________________________________________  12 Lead EKG   ____________________________________________  RADIOLOGY  ED MD interpretation: Plain films reviewed by me with right hip arthritis without evidence of superimposed fracture  Official radiology report(s): DG Hip Unilat W or Wo Pelvis 2-3 Views Right  Result Date: 02/06/2021 CLINICAL DATA:  Question mild degenerative changes of the acromioclavicular joint. EXAM: DG HIP (WITH OR WITHOUT PELVIS) 2-3V RIGHT COMPARISON:  X-ray right hip 04/26/2013. FINDINGS: There is no evidence of hip fracture or dislocation. No acute displaced fracture or diastasis of the bones of the pelvis. Severe right hip degenerative changes. No aggressive appearing focal bone abnormality. IMPRESSION: 1.  No acute displaced fracture or dislocation. 2. Severe right hip degenerative changes. Electronically Signed   By: Tish Frederickson M.D.    On: 02/06/2021 16:35    ____________________________________________   PROCEDURES and INTERVENTIONS  Procedure(s) performed (including Critical Care):  Procedures  Medications - No data to display  ____________________________________________   MDM / ED COURSE   55 year old male presents to the ED with acute on chronic right hip pain from arthritis, without evidence of significant injury, fracture or dislocation, amenable to outpatient management.  Well-appearing patient without evidence of systemic illness or symptoms.  No signs of open injury or significant trauma to the leg.  Does have some discomfort with passive ranging, but ROM is intact.  X-ray without fracture or dislocation.  We will provide referral to orthopedics and recommendations for management at home.  Return precautions discussed.     ____________________________________________   FINAL CLINICAL IMPRESSION(S) / ED DIAGNOSES  Final diagnoses:  Chronic right hip pain     ED Discharge Orders     None        Obinna Ehresman   Note:  This document was prepared using Dragon voice recognition software and may include unintentional dictation errors.    Delton Prairie, MD 02/06/21 [phone]

## 2021-02-06 NOTE — Discharge Instructions (Addendum)
Use Tylenol for pain and fevers.  Up to 1000 mg per dose, up to 4 times per day.  Do not take more than 4000 mg of Tylenol/acetaminophen within 24 hours..  Use naproxen/Aleve for anti-inflammatory pain relief. Use up to 500mg every 12 hours. Do not take more frequently than this. Do not use other NSAIDs (ibuprofen, Advil) while taking this medication. It is safe to take Tylenol with this.   

## 2021-07-09 ENCOUNTER — Encounter (HOSPITAL_COMMUNITY): Payer: Self-pay | Admitting: *Deleted

## 2021-07-09 ENCOUNTER — Emergency Department (HOSPITAL_COMMUNITY)
Admission: EM | Admit: 2021-07-09 | Discharge: 2021-07-09 | Disposition: A | Discharge status: Home / Self Care | Payer: Medicaid Other | Attending: Emergency Medicine | Admitting: Emergency Medicine

## 2021-07-09 ENCOUNTER — Other Ambulatory Visit: Payer: Self-pay

## 2021-07-09 DIAGNOSIS — Z7984 Long term (current) use of oral hypoglycemic drugs: Secondary | ICD-10-CM | POA: Diagnosis not present

## 2021-07-09 DIAGNOSIS — Z794 Long term (current) use of insulin: Secondary | ICD-10-CM | POA: Diagnosis not present

## 2021-07-09 DIAGNOSIS — R739 Hyperglycemia, unspecified: Secondary | ICD-10-CM

## 2021-07-09 DIAGNOSIS — E1165 Type 2 diabetes mellitus with hyperglycemia: Secondary | ICD-10-CM | POA: Insufficient documentation

## 2021-07-09 DIAGNOSIS — R030 Elevated blood-pressure reading, without diagnosis of hypertension: Secondary | ICD-10-CM | POA: Diagnosis not present

## 2021-07-09 DIAGNOSIS — I10 Essential (primary) hypertension: Secondary | ICD-10-CM

## 2021-07-09 HISTORY — DX: Type 2 diabetes mellitus without complications: E11.9

## 2021-07-09 LAB — URINALYSIS, ROUTINE W REFLEX MICROSCOPIC
Bilirubin Urine: NEGATIVE
Glucose, UA: 150 mg/dL — AB
Hgb urine dipstick: NEGATIVE
Ketones, ur: NEGATIVE mg/dL
Leukocytes,Ua: NEGATIVE
Nitrite: NEGATIVE
Protein, ur: 30 mg/dL — AB
Specific Gravity, Urine: 1.015 (ref 1.005–1.030)
pH: 8 (ref 5.0–8.0)

## 2021-07-09 LAB — CBC WITH DIFFERENTIAL/PLATELET
Abs Immature Granulocytes: 0.04 10*3/uL (ref 0.00–0.07)
Basophils Absolute: 0.1 10*3/uL (ref 0.0–0.1)
Basophils Relative: 1 %
Eosinophils Absolute: 0.2 10*3/uL (ref 0.0–0.5)
Eosinophils Relative: 2 %
HCT: 42.2 % (ref 39.0–52.0)
Hemoglobin: 13.7 g/dL (ref 13.0–17.0)
Immature Granulocytes: 0 %
Lymphocytes Relative: 28 %
Lymphs Abs: 2.8 10*3/uL (ref 0.7–4.0)
MCH: 32.4 pg (ref 26.0–34.0)
MCHC: 32.5 g/dL (ref 30.0–36.0)
MCV: 99.8 fL (ref 80.0–100.0)
Monocytes Absolute: 0.7 10*3/uL (ref 0.1–1.0)
Monocytes Relative: 7 %
Neutro Abs: 6.2 10*3/uL (ref 1.7–7.7)
Neutrophils Relative %: 62 %
Platelets: 340 10*3/uL (ref 150–400)
RBC: 4.23 MIL/uL (ref 4.22–5.81)
RDW: 11.9 % (ref 11.5–15.5)
WBC: 10.1 10*3/uL (ref 4.0–10.5)
nRBC: 0 % (ref 0.0–0.2)

## 2021-07-09 LAB — BLOOD GAS, VENOUS
Acid-Base Excess: 2.4 mmol/L — ABNORMAL HIGH (ref 0.0–2.0)
Bicarbonate: 28.8 mmol/L — ABNORMAL HIGH (ref 20.0–28.0)
Drawn by: 5112
FIO2: 21 %
O2 Saturation: 59.3 %
Patient temperature: 36.7
pCO2, Ven: 50 mmHg (ref 44–60)
pH, Ven: 7.36 (ref 7.25–7.43)
pO2, Ven: <31 mmHg — CL (ref 32–45)

## 2021-07-09 LAB — BASIC METABOLIC PANEL
Anion gap: 7 (ref 5–15)
BUN: 15 mg/dL (ref 6–20)
CO2: 27 mmol/L (ref 22–32)
Calcium: 8.7 mg/dL — ABNORMAL LOW (ref 8.9–10.3)
Chloride: 104 mmol/L (ref 98–111)
Creatinine, Ser: 0.77 mg/dL (ref 0.61–1.24)
GFR, Estimated: >60 mL/min (ref >60)
Glucose, Bld: 228 mg/dL — ABNORMAL HIGH (ref 70–99)
Potassium: 4 mmol/L (ref 3.5–5.1)
Sodium: 138 mmol/L (ref 135–145)

## 2021-07-09 LAB — CBG MONITORING, ED: Glucose-Capillary: 249 mg/dL — ABNORMAL HIGH (ref 70–99)

## 2021-07-09 LAB — BETA-HYDROXYBUTYRIC ACID: Beta-Hydroxybutyric Acid: 0.06 mmol/L (ref 0.05–0.27)

## 2021-07-09 NOTE — ED Triage Notes (Signed)
Pt believes his blood sugar is elevated.  Hasn't checked his sugar in over a week.  C/o generalized weakness and fatigue. Pt states his battery has gone bad on his meter.  ?

## 2021-07-09 NOTE — ED Provider Notes (Signed)
?[facility] EMERGENCY DEPARTMENT ?Provider Note ? ? ?CSN: [id number] ?Arrival date & time: 07/09/21  1815 ? ?  ? ?History ? ?Chief Complaint  ?Patient presents with  ? Hyperglycemia  ? ? ?Richard Chambers is a 56 y.o. male who presents today for concern of hyperglycemia.  He states that his glucometer is broken.  He did not bring that with him today.  He states that he has been out of his Lantus for the past 3 days.  He reports he supposed to be taking 35 units every night.  He states that it is in [place].  He denies any fevers, no other complaints or concerns today. ? ?HPI ? ?  ? ?Home Medications ?Prior to Admission medications   ?Medication Sig Start Date End Date Taking? Authorizing Provider  ?HYDROcodone-acetaminophen (VICODIN) 5-500 MG per tablet Take 1 tablet by mouth every 6 (six) hours as needed.      [provider]  ?ibuprofen (ADVIL,MOTRIN) 800 MG tablet Take 800 mg by mouth every 6 (six) hours as needed.      [provider]  ?metFORMIN (GLUCOPHAGE) 500 MG tablet Take 1 tablet (500 mg total) by mouth 2 (two) times daily with a meal. 07/18/15 07/17/16  Emily Filbert, MD  ?naproxen (NAPROSYN) 500 MG tablet Take 1 tablet (500 mg total) by mouth 2 (two) times daily as needed. 07/21/19   Glynn Octave, MD  ?   ? ?Allergies    ?Patient has no known allergies.   ? ?Review of Systems   ?Review of Systems ? ?Physical Exam ?Updated Vital Signs ?BP (!) 149/88 (BP Location: Right Arm)   Pulse 82   Temp 98 ?F (36.7 ?C) (Oral)   Resp 17   Ht 5\' 9"  (1.753 m)   Wt 77.8 kg   SpO2 96%   BMI 25.33 kg/m?  ?Physical Exam ?Vitals and nursing note reviewed.  ?Constitutional:   ?   General: He is not in acute distress. ?   Appearance: He is not diaphoretic.  ?HENT:  ?   Head: Normocephalic and atraumatic.  ?Eyes:  ?   General: No scleral icterus.    ?   Right eye: No discharge.     ?   Left eye: No discharge.  ?   Conjunctiva/sclera: Conjunctivae normal.  ?Cardiovascular:  ?   Rate and Rhythm:  Normal rate and regular rhythm.  ?Pulmonary:  ?   Effort: Pulmonary effort is normal. No respiratory distress.  ?   Breath sounds: No stridor.  ?Abdominal:  ?   General: There is no distension.  ?Musculoskeletal:     ?   General: No deformity.  ?   Cervical back: Normal range of motion.  ?Skin: ?   General: Skin is warm and dry.  ?Neurological:  ?   General: No focal deficit present.  ?   Mental Status: He is alert.  ?   Motor: No abnormal muscle tone.  ?Psychiatric:     ?   Mood and Affect: Mood normal.     ?   Behavior: Behavior normal.  ? ? ?ED Results / Procedures / Treatments   ?Labs ?(all labs ordered are listed, but only abnormal results are displayed) ?Labs Reviewed  ?BASIC METABOLIC PANEL - Abnormal; Notable for the following components:  ?    Result Value  ? Glucose, Bld 228 (*)   ? Calcium 8.7 (*)   ? All other components within normal limits  ?URINALYSIS, ROUTINE W REFLEX MICROSCOPIC -  Abnormal; Notable for the following components:  ? Glucose, UA 150 (*)   ? Protein, ur 30 (*)   ? Bacteria, UA RARE (*)   ? All other components within normal limits  ?BLOOD GAS, VENOUS - Abnormal; Notable for the following components:  ? pO2, Ven <31 (*)   ? Bicarbonate 28.8 (*)   ? Acid-Base Excess 2.4 (*)   ? All other components within normal limits  ?CBG MONITORING, ED - Abnormal; Notable for the following components:  ? Glucose-Capillary 249 (*)   ? All other components within normal limits  ?CBC WITH DIFFERENTIAL/PLATELET  ?BETA-HYDROXYBUTYRIC ACID  ? ? ?EKG ?None ? ?Radiology ?No results found. ? ?Procedures ?Procedures  ? ? ?Medications Ordered in ED ?Medications - No data to display ? ?ED Course/ Medical Decision Making/ A&P ?  ?                        ?Medical Decision Making ?Patient is a 56 year old man who presents today for concern of hyperglycemia.  He reports he has been having polyuria, polydipsia after not taking his Lantus for 3 days.  He has not checked his blood sugar as his glucometer is broken per  his report. ?Here labs are obtained, his sugars are elevated at about 250, VBG without evidence of acidosis, CMP is unremarkable.  He does not have an anion gap.  Beta hydroxybutyric acid is not elevated.  His urine does not have ketones, does have 150 glucose.  ? ?When he went to discussed results with patient, he states that his friend found one of his Lantus pens and he will be able to use that.  I offered him a dose of insulin here, however we do not have Lantus on formulary, he declined insulin or other treatment here and electing instead to go take his Lantus at home. ? ?Return precautions were discussed with patient who states their understanding.  At the time of discharge patient denied any unaddressed complaints or concerns.  Patient is agreeable for discharge home. ? ?Note: Portions of this report may have been transcribed using voice recognition software. Every effort was made to ensure accuracy; however, inadvertent computerized transcription errors may be present ? ? ? ? ?Amount and/or Complexity of Data Reviewed ?Labs: ordered. ?   Details: Please see above for details ? ? ? ? ? ? ? ? ? ?Final Clinical Impression(s) / ED Diagnoses ?Final diagnoses:  ?Hyperglycemia  ?Hypertension, unspecified type  ? ? ?Rx / DC Orders ?ED Discharge Orders   ? ? None  ? ?  ? ? ?  ?Cristina Gong, [place] ?07/09/21 2341 ? ?  ?Sloan Leiter, DO ?07/11/21 0117 ? ?

## 2021-07-09 NOTE — Discharge Instructions (Signed)
Please take your normal medications when you get home. ?While in the emergency room your blood pressure was elevated.  Please follow-up with your primary care doctor to ensure you do not need to be on medications for this.  I would recommend having this follow-up in the next month. ?

## 2021-07-09 NOTE — ED Notes (Signed)
Abg o2 less than 31  ?

## 2021-12-15 ENCOUNTER — Emergency Department (HOSPITAL_COMMUNITY)
Admission: EM | Admit: 2021-12-15 | Discharge: 2021-12-15 | Disposition: A | Discharge status: Home / Self Care | Payer: Medicaid Other | Attending: Emergency Medicine | Admitting: Emergency Medicine

## 2021-12-15 ENCOUNTER — Encounter (HOSPITAL_COMMUNITY): Payer: Self-pay | Admitting: Emergency Medicine

## 2021-12-15 ENCOUNTER — Emergency Department (HOSPITAL_COMMUNITY): Payer: Medicaid Other

## 2021-12-15 DIAGNOSIS — S7001XA Contusion of right hip, initial encounter: Secondary | ICD-10-CM | POA: Insufficient documentation

## 2021-12-15 DIAGNOSIS — S79911A Unspecified injury of right hip, initial encounter: Secondary | ICD-10-CM | POA: Diagnosis present

## 2021-12-15 MED ORDER — HYDROCODONE-ACETAMINOPHEN 5-325 MG PO TABS
1.0000 | ORAL_TABLET | ORAL | Status: AC
  Administered 2021-12-15: 1 via ORAL
  Filled 2021-12-15: qty 1
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
## 2022-12-18 ENCOUNTER — Emergency Department: Payer: Medicaid Other

## 2022-12-18 ENCOUNTER — Emergency Department
Admission: EM | Admit: 2022-12-18 | Discharge: 2022-12-18 | Disposition: A | Discharge status: Home / Self Care | Payer: Medicaid Other | Attending: Emergency Medicine | Admitting: Emergency Medicine

## 2022-12-18 ENCOUNTER — Other Ambulatory Visit: Payer: Self-pay

## 2022-12-18 DIAGNOSIS — R739 Hyperglycemia, unspecified: Secondary | ICD-10-CM

## 2022-12-18 DIAGNOSIS — R1030 Lower abdominal pain, unspecified: Secondary | ICD-10-CM

## 2022-12-18 DIAGNOSIS — F17210 Nicotine dependence, cigarettes, uncomplicated: Secondary | ICD-10-CM | POA: Diagnosis not present

## 2022-12-18 DIAGNOSIS — N390 Urinary tract infection, site not specified: Secondary | ICD-10-CM | POA: Diagnosis not present

## 2022-12-18 LAB — CBC
HCT: 50.8 % (ref 39.0–52.0)
Hemoglobin: 16.6 g/dL (ref 13.0–17.0)
MCH: 31.8 pg (ref 26.0–34.0)
MCHC: 32.7 g/dL (ref 30.0–36.0)
MCV: 97.3 fL (ref 80.0–100.0)
Platelets: 344 10*3/uL (ref 150–400)
RBC: 5.22 MIL/uL (ref 4.22–5.81)
RDW: 12 % (ref 11.5–15.5)
WBC: 13.4 10*3/uL — ABNORMAL HIGH (ref 4.0–10.5)
nRBC: 0 % (ref 0.0–0.2)

## 2022-12-18 LAB — URINALYSIS, ROUTINE W REFLEX MICROSCOPIC
Bacteria, UA: NONE SEEN
Bilirubin Urine: NEGATIVE
Glucose, UA: 50 mg/dL — AB
Hgb urine dipstick: NEGATIVE
Ketones, ur: NEGATIVE mg/dL
Nitrite: NEGATIVE
Protein, ur: NEGATIVE mg/dL
Specific Gravity, Urine: >1.046 — ABNORMAL HIGH (ref 1.005–1.030)
Squamous Epithelial / HPF: 0 /HPF (ref 0–5)
pH: 6 (ref 5.0–8.0)

## 2022-12-18 LAB — COMPREHENSIVE METABOLIC PANEL
ALT: 54 U/L — ABNORMAL HIGH (ref 0–44)
AST: 43 U/L — ABNORMAL HIGH (ref 15–41)
Albumin: 4.1 g/dL (ref 3.5–5.0)
Alkaline Phosphatase: 91 U/L (ref 38–126)
Anion gap: 12 (ref 5–15)
BUN: 18 mg/dL (ref 6–20)
CO2: 21 mmol/L — ABNORMAL LOW (ref 22–32)
Calcium: 9.3 mg/dL (ref 8.9–10.3)
Chloride: 100 mmol/L (ref 98–111)
Creatinine, Ser: 0.97 mg/dL (ref 0.61–1.24)
GFR, Estimated: >60 mL/min (ref >60)
Glucose, Bld: 264 mg/dL — ABNORMAL HIGH (ref 70–99)
Potassium: 4.1 mmol/L (ref 3.5–5.1)
Sodium: 133 mmol/L — ABNORMAL LOW (ref 135–145)
Total Bilirubin: 1.1 mg/dL (ref 0.3–1.2)
Total Protein: 7.9 g/dL (ref 6.5–8.1)

## 2022-12-18 LAB — LIPASE, BLOOD: Lipase: 36 U/L (ref 11–51)

## 2022-12-18 LAB — CBG MONITORING, ED: Glucose-Capillary: 285 mg/dL — ABNORMAL HIGH (ref 70–99)

## 2022-12-18 MED ORDER — METFORMIN HCL 500 MG PO TABS: 500.0000 mg | ORAL_TABLET | Freq: Two times a day (BID) | ORAL | 0 refills | Status: DC

## 2022-12-18 MED ORDER — CEFPODOXIME PROXETIL 200 MG PO TABS: 200.0000 mg | ORAL_TABLET | Freq: Two times a day (BID) | ORAL | 0 refills | Status: AC

## 2022-12-18 MED ORDER — IOHEXOL 300 MG/ML  SOLN
100.0000 mL | Freq: Once | INTRAMUSCULAR | Status: AC | PRN
  Administered 2022-12-18: 100 mL via INTRAVENOUS

## 2022-12-18 MED ORDER — SODIUM CHLORIDE 0.9 % IV BOLUS
1000.0000 mL | Freq: Once | INTRAVENOUS | Status: AC
  Administered 2022-12-18: 1000 mL via INTRAVENOUS

## 2022-12-18 MED ORDER — METFORMIN HCL 500 MG PO TABS: 500.0000 mg | ORAL_TABLET | Freq: Two times a day (BID) | ORAL | 0 refills | Status: AC

## 2022-12-18 NOTE — ED Notes (Signed)
Patient ambulatory to restroom independently

## 2022-12-18 NOTE — Discharge Instructions (Addendum)
Please take the antibiotics as prescribed as well as your metformin for your diabetes.  You have also been referred to primary care.  Please return for any new, worsening, or change in symptoms or other concerns.  It was a pleasure caring for you today.

## 2022-12-18 NOTE — Group Note (Deleted)

## 2022-12-18 NOTE — ED Triage Notes (Signed)
Pt to ED via POV from home. Pt reports lower abdominal pain since Saturday. Pt also reports N/V/D. Pt reports his sugar may be high and hasn't checked it "in a while".

## 2022-12-18 NOTE — ED Provider Notes (Signed)
[facility] Provider Note    Event Date/Time   First MD Initiated Contact with Patient 12/18/22 1138     (approximate)   History   Abdominal Pain   HPI  Richard Chambers is a 57 y.o. male with a history of cholecystectomy and appendectomy who presents today for evaluation of lower abdominal pain and high blood sugar.  Patient reports that he has been visiting here from [place] to care for his elderly aunt, and has not been able to fill his insulin for approximately 1 month.  He reports that his pain in his abdomen began 2 days ago and is across his low abdomen.  He has had mild nausea, vomiting, diarrhea with this.  No fevers or chills.  No urinary symptoms.  Patient Active Problem List   Diagnosis Date Noted   ALCOHOL ABUSE 05/06/2009   TOBACCO ABUSE 05/06/2009   SHORTNESS OF BREATH 05/06/2009   CHEST PAIN UNSPECIFIED 05/06/2009          Physical Exam   Triage Vital Signs: ED Triage Vitals  Encounter Vitals Group     BP 12/18/22 1113 114/76     Systolic BP Percentile --      Diastolic BP Percentile --      Pulse Rate 12/18/22 1113 90     Resp 12/18/22 1113 18     Temp 12/18/22 1113 98.4 F (36.9 C)     Temp Source 12/18/22 1113 Oral     SpO2 12/18/22 1113 99 %     Weight --      Height --      Head Circumference --      Peak Flow --      Pain Score 12/18/22 1114 10     Pain Loc --      Pain Education --      Exclude from Growth Chart --     Most recent vital signs: Vitals:   12/18/22 1113  BP: 114/76  Pulse: 90  Resp: 18  Temp: 98.4 F (36.9 C)  SpO2: 99%    Physical Exam Vitals and nursing note reviewed.  Constitutional:      General: Awake and alert. No acute distress.    Appearance: Normal appearance. The patient is normal weight.  HENT:     Head: Normocephalic and atraumatic.     Mouth: Mucous membranes are moist.  Eyes:     General: PERRL. Normal EOMs        Right eye: No discharge.        Left eye: No  discharge.     Conjunctiva/sclera: Conjunctivae normal.  Cardiovascular:     Rate and Rhythm: Normal rate and regular rhythm.     Pulses: Normal pulses.  Pulmonary:     Effort: Pulmonary effort is normal. No respiratory distress.     Breath sounds: Normal breath sounds.  Abdominal:     Abdomen is soft. There is mild suprapubic abdominal tenderness. No rebound or guarding. No distention.  No CVA tenderness. Musculoskeletal:        General: No swelling. Normal range of motion.     Cervical back: Normal range of motion and neck supple.  Skin:    General: Skin is warm and dry.     Capillary Refill: Capillary refill takes less than 2 seconds.     Findings: No rash.  Neurological:     Mental Status: The patient is awake and alert.      ED Results /  Procedures / Treatments   Labs (all labs ordered are listed, but only abnormal results are displayed) Labs Reviewed  COMPREHENSIVE METABOLIC PANEL - Abnormal; Notable for the following components:      Result Value   Sodium 133 (*)    CO2 21 (*)    Glucose, Bld 264 (*)    AST 43 (*)    ALT 54 (*)    All other components within normal limits  CBC - Abnormal; Notable for the following components:   WBC 13.4 (*)    All other components within normal limits  URINALYSIS, ROUTINE W REFLEX MICROSCOPIC - Abnormal; Notable for the following components:   Color, Urine YELLOW (*)    APPearance CLEAR (*)    Specific Gravity, Urine >1.046 (*)    Glucose, UA 50 (*)    Leukocytes,Ua TRACE (*)    All other components within normal limits  CBG MONITORING, ED - Abnormal; Notable for the following components:   Glucose-Capillary 285 (*)    All other components within normal limits  LIPASE, BLOOD     EKG     RADIOLOGY I independently reviewed and interpreted imaging and agree with radiologists findings.     PROCEDURES:  Critical Care performed:   Procedures   MEDICATIONS ORDERED IN ED: Medications  sodium chloride 0.9 % bolus  1,000 mL (0 mLs Intravenous Stopped 12/18/22 1315)  iohexol (OMNIPAQUE) 300 MG/ML solution 100 mL (100 mLs Intravenous Contrast Given 12/18/22 1236)     IMPRESSION / MDM / ASSESSMENT AND PLAN / ED COURSE  I reviewed the triage vital signs and the nursing notes.   Differential diagnosis includes, but is not limited to, diverticulitis, urinary tract infection, obstruction, appendicitis, hyperglycemia, DKA.  Patient is awake and alert, hemodynamically stable and afebrile.  He is nontoxic in appearance.  Labs obtained in triage are remarkable for a leukocytosis to 13.6 as well as an elevated glucose to 285.  He has normal anion gap, normal bicarb, not consistent with DKA.  CT scan was obtained for further evaluation and is remarkable for bladder wall thickening consistent with cystitis.  This is the location of his discomfort, though his urinalysis is not markedly suspicious for infection.  We discussed the option of initiating antibiotic therapy versus sending for culture and waiting.  He prefers to start antibiotics now.  No flank pain or fever to suggest pyelonephritis.  His metformin was also refilled for him given that he is visiting from [place] and is unsure how long he is going to be here for.  He was also referred to primary care as he anticipates being here for several months.  We discussed return precautions the importance of close outpatient follow-up.  Patient understands and agrees with plan.  He was discharged in stable condition.   Patient's presentation is most consistent with acute complicated illness / injury requiring diagnostic workup.    FINAL CLINICAL IMPRESSION(S) / ED DIAGNOSES   Final diagnoses:  Lower urinary tract infectious disease  Lower abdominal pain  Hyperglycemia     Rx / DC Orders   ED Discharge Orders          Ordered    cefpodoxime (VANTIN) 200 MG tablet  2 times daily        12/18/22 1457    metFORMIN (GLUCOPHAGE) 500 MG tablet  2 times daily with  meals,   Status:  Discontinued        12/18/22 1457    Ambulatory Referral to Primary Care (Establish  Care)       Comments: diabetes   12/18/22 1457    metFORMIN (GLUCOPHAGE) 500 MG tablet  2 times daily with meals        12/18/22 1458             Note:  This document was prepared using Dragon voice recognition software and may include unintentional dictation errors.   Jackelyn Hoehn, PA-C 12/18/22 1504    Pilar Jarvis, MD 12/18/22 [phone]

## 2023-01-29 ENCOUNTER — Encounter (HOSPITAL_COMMUNITY): Payer: Self-pay

## 2023-01-29 ENCOUNTER — Emergency Department (HOSPITAL_COMMUNITY): Payer: Medicaid Other

## 2023-01-29 ENCOUNTER — Emergency Department (HOSPITAL_COMMUNITY)
Admission: EM | Admit: 2023-01-29 | Discharge: 2023-01-29 | Disposition: A | Discharge status: Home / Self Care | Payer: Medicaid Other | Attending: Emergency Medicine | Admitting: Emergency Medicine

## 2023-01-29 ENCOUNTER — Other Ambulatory Visit: Payer: Self-pay

## 2023-01-29 DIAGNOSIS — E119 Type 2 diabetes mellitus without complications: Secondary | ICD-10-CM | POA: Insufficient documentation

## 2023-01-29 DIAGNOSIS — Z794 Long term (current) use of insulin: Secondary | ICD-10-CM | POA: Insufficient documentation

## 2023-01-29 DIAGNOSIS — M25511 Pain in right shoulder: Secondary | ICD-10-CM | POA: Diagnosis present

## 2023-01-29 DIAGNOSIS — G8929 Other chronic pain: Secondary | ICD-10-CM | POA: Insufficient documentation

## 2023-01-29 LAB — BASIC METABOLIC PANEL
Anion gap: 9 (ref 5–15)
BUN: 18 mg/dL (ref 6–20)
CO2: 22 mmol/L (ref 22–32)
Calcium: 9 mg/dL (ref 8.9–10.3)
Chloride: 103 mmol/L (ref 98–111)
Creatinine, Ser: 1.1 mg/dL (ref 0.61–1.24)
GFR, Estimated: >60 mL/min (ref >60)
Glucose, Bld: 217 mg/dL — ABNORMAL HIGH (ref 70–99)
Potassium: 4.2 mmol/L (ref 3.5–5.1)
Sodium: 134 mmol/L — ABNORMAL LOW (ref 135–145)

## 2023-01-29 LAB — CBC WITH DIFFERENTIAL/PLATELET
Abs Immature Granulocytes: 0.05 10*3/uL (ref 0.00–0.07)
Basophils Absolute: 0.1 10*3/uL (ref 0.0–0.1)
Basophils Relative: 1 %
Eosinophils Absolute: 0.2 10*3/uL (ref 0.0–0.5)
Eosinophils Relative: 2 %
HCT: 43.9 % (ref 39.0–52.0)
Hemoglobin: 14.3 g/dL (ref 13.0–17.0)
Immature Granulocytes: 1 %
Lymphocytes Relative: 25 %
Lymphs Abs: 2.6 10*3/uL (ref 0.7–4.0)
MCH: 31.8 pg (ref 26.0–34.0)
MCHC: 32.6 g/dL (ref 30.0–36.0)
MCV: 97.6 fL (ref 80.0–100.0)
Monocytes Absolute: 0.8 10*3/uL (ref 0.1–1.0)
Monocytes Relative: 8 %
Neutro Abs: 6.4 10*3/uL (ref 1.7–7.7)
Neutrophils Relative %: 63 %
Platelets: 301 10*3/uL (ref 150–400)
RBC: 4.5 MIL/uL (ref 4.22–5.81)
RDW: 12.6 % (ref 11.5–15.5)
WBC: 10.2 10*3/uL (ref 4.0–10.5)
nRBC: 0 % (ref 0.0–0.2)

## 2023-01-29 LAB — CBG MONITORING, ED: Glucose-Capillary: 241 mg/dL — ABNORMAL HIGH (ref 70–99)

## 2023-01-29 MED ORDER — AMOXICILLIN 500 MG PO CAPS: 500.0000 mg | ORAL_CAPSULE | Freq: Three times a day (TID) | ORAL | 0 refills | Status: DC

## 2023-01-29 MED ORDER — IBUPROFEN 400 MG PO TABS
400.0000 mg | ORAL_TABLET | Freq: Once | ORAL | Status: AC
  Administered 2023-01-29: 400 mg via ORAL
  Filled 2023-01-29: qty 1

## 2023-01-29 NOTE — Discharge Instructions (Addendum)
You are seen today for running out of insulin, shoulder pain, and dental pain.  Checked labs which did not show any evidence of diabetic emergencies or other abnormalities.  Your blood sugar was slightly high in the lower 200s.  You need to get your insulin refilled by your primary care doctor.  I have referred you to a PCP in the area for follow-up to get insulin and additional needs as below:   We did an x-ray of your shoulder which did not show any signs of broken bones or dislocated bones.  You likely need an ultrasound of the mass on the back of your shoulder to make sure it does not look cancerous.  Follow-up with your PCP to get this arranged.   For your dental pain you have several cavities.  He needs to make an appointment with a dentist.  I have attached resources of several dentists in the area who you could make appointments with.  I am sending you with a 1 week supply of an antibiotic based there is an abscess or infection that we cannot see today.  These take this as prescribed.

## 2023-01-29 NOTE — ED Triage Notes (Signed)
Pt c/o right shoulder painx2wks. Pt denies injury. Pt c/o drowsinessx4d. Pt states he ran out of insulin 3-4wks.

## 2023-01-29 NOTE — ED Provider Notes (Addendum)
[facility] EMERGENCY DEPARTMENT AT [facility] Provider Note   CSN: [id number] Arrival date & time: 01/29/23  1718     History  Chief Complaint  Patient presents with   Shoulder Pain    Richard Chambers is a 57 y.o. male.  This is a 57 year old male with past medical history of type 2 diabetes on insulin running to the emergency department for multiple concerns.  The first is he ran out of his insulin a month ago.  Usually takes Lantus 25 units every morning.  Was prescribed by PCP in [place] but he is unable to get to [facility] and needs a new PCP here.  Denies fevers, polyuria, polydipsia, neurologic symptoms.  The second concern is right shoulder pain.  Patient has a mass to the right posterior shoulder that has been there for several years and is slowly increasing in size.  Mass is painful.  He denies trauma to the shoulder.  Denies difficulty moving the shoulder.  Third concern is dental pain.  Patient has generalized dental pain to the right side of his mouth and upper and lower teeth.  He currently does not have a dentist since he was told after hip surgery he could not see a dentist for a year.  No drooling, difficulty swallowing, pain to floor of mouth, voice changes, change in p.o. intake.   The history is provided by the patient.       Home Medications Prior to Admission medications   Medication Sig Start Date End Date Taking? Authorizing Provider  amoxicillin (AMOXIL) 500 MG capsule Take 1 capsule (500 mg total) by mouth 3 (three) times daily. 01/29/23  Yes Karmen Stabs, MD  HYDROcodone-acetaminophen (VICODIN) 5-500 MG per tablet Take 1 tablet by mouth every 6 (six) hours as needed.      [provider]  ibuprofen (ADVIL,MOTRIN) 800 MG tablet Take 800 mg by mouth every 6 (six) hours as needed.      [provider]  metFORMIN (GLUCOPHAGE) 500 MG tablet Take 1 tablet (500 mg total) by mouth 2 (two) times daily with a meal. 12/18/22 01/17/23  Poggi,  Eileen Stanford E, PA-C  naproxen (NAPROSYN) 500 MG tablet Take 1 tablet (500 mg total) by mouth 2 (two) times daily as needed. 07/21/19   Glynn Octave, MD      Allergies    Patient has no known allergies.    Review of Systems   Review of Systems as per HPI above  Physical Exam Updated Vital Signs BP (!) 165/91 (BP Location: Right Arm)   Pulse 72   Temp 98.2 F (36.8 C)   Resp 20   Ht 5\' 9"  (1.753 m)   Wt 77.8 kg   SpO2 99%   BMI 25.33 kg/m  Physical Exam Constitutional:      General: He is not in acute distress.    Appearance: Normal appearance. He is not ill-appearing.  HENT:     Head: Normocephalic and atraumatic.     Nose: Nose normal.     Mouth/Throat:     Mouth: Mucous membranes are moist.     Comments: Poor dentition. Multiple dental caries throughout.  Several missing teeth.  No evidence of dental abscess.  No pain or swelling to floor of mouth or submental space. Normal tonsils, no uvular deviation. Tolerating secretions Eyes:     Extraocular Movements: Extraocular movements intact.     Pupils: Pupils are equal, round, and reactive to light.  Cardiovascular:     Rate  and Rhythm: Normal rate and regular rhythm.     Pulses: Normal pulses.     Heart sounds: Normal heart sounds.  Pulmonary:     Effort: Pulmonary effort is normal.     Breath sounds: Normal breath sounds.  Abdominal:     General: There is no distension.     Palpations: Abdomen is soft.     Tenderness: There is no abdominal tenderness. There is no right CVA tenderness, left CVA tenderness, guarding or rebound.  Musculoskeletal:     Cervical back: Neck supple. No rigidity.     Right lower leg: No edema.     Left lower leg: No edema.     Comments: 5 x 5 cm soft nodule to the right rear deltoid.  No overlying erythema. Normal ROM of both shoulders active and passive. Normal motor and sensory and vascular exam of bilateral upper extremities  Skin:    General: Skin is warm and dry.     Capillary Refill:  Capillary refill takes less than 2 seconds.  Neurological:     General: No focal deficit present.     Mental Status: He is alert.     Cranial Nerves: No cranial nerve deficit.     Sensory: No sensory deficit.     Motor: No weakness.     Coordination: Coordination normal.     ED Results / Procedures / Treatments   Labs (all labs ordered are listed, but only abnormal results are displayed) Labs Reviewed  BASIC METABOLIC PANEL - Abnormal; Notable for the following components:      Result Value   Sodium 134 (*)    Glucose, Bld 217 (*)    All other components within normal limits  CBG MONITORING, ED - Abnormal; Notable for the following components:   Glucose-Capillary 241 (*)    All other components within normal limits  CBC WITH DIFFERENTIAL/PLATELET    EKG None  Radiology No results found.  Procedures Procedures    Medications Ordered in ED Medications  ibuprofen (ADVIL) tablet 400 mg (has no administration in time range)    ED Course/ Medical Decision Making/ A&P                                 Medical Decision Making 57 year old male with several complaints as above.   For being off of insulin for 1 month, differential considered includes DKA, HHS, hyperglycemia.  Labs are without evidence of DKA as bicarb and anion gap are normal.  He has mild hyperglycemia of 217.  Clinically does not appear dehydrated.  Presentation not consistent with DKA or HHS. I referred him to a new PCP in the area for follow-up of his diabetes and to get additional insulin prescribed.  He is agreeable to follow-up with them.  For shoulder pain/posterior shoulder swelling/mass, differential considered includes benign soft tissue mass, soft tissue malignancy such as sarcoma or myosarcoma, bone tumor, septic joint, musculoskeletal pathology.  Exam described above is not consistent with septic joint as able to range without deficit; further workup is not indicated. No erythema or signs of  cellulitis and ongoing nature of symptoms less c/w abscess. X-ray of right shoulder without evidence of fracture or dislocation. Favor soft tissue mass. Round shape, slow growing nature over years, and tenderness are reassuring aspects and more consistent with benign etiology.  He would however need an ultrasound and further testing, potentially including biopsy in the future to  further evaluate this.  Discussed this with the patient and he is agreeable to follow-up with a PCP to obtain this testing.   For dental pain, differential considered includes dental abscess, dental caries, peritonsillar abscess, Ludwig angina, soft tissue infection of the face or neck.  Exam described above does not show evidence of PTA, Ludwig angina, or overt infection or abscess.  He has diffusely poor dentition and dental caries which are the likely cause of symptoms.  Due to inability to rule out occult abscess, will send home with 1 week of amoxicillin.  Is agreeable to take this medication.   Plan for PCP follow-up as well as dental follow-up.  Referrals provided in his discharge paperwork.  Strict and precautions were discussed.  Ibuprofen given for pain while in ED.  Discharged ambulatory in stable condition without further event.  Amount and/or Complexity of Data Reviewed Labs: ordered. Decision-making details documented in ED Course. Radiology: ordered. Decision-making details documented in ED Course.  Risk OTC drugs. Prescription drug management. Decision regarding hospitalization.         Final Clinical Impression(s) / ED Diagnoses Final diagnoses:  Chronic right shoulder pain    Rx / DC Orders ED Discharge Orders          Ordered    amoxicillin (AMOXIL) 500 MG capsule  3 times daily        01/29/23 2005              Kambri Dismore, MD 01/29/23 2010    Karmen Stabs, MD 01/29/23 2010    Blane Ohara, MD 01/29/23 2241

## 2023-02-09 DEATH — deceased

## 2023-05-07 IMAGING — CR DG HIP (WITH OR WITHOUT PELVIS) 2-3V*R*
3 series · 3 of 3 positions shown · non-contrast
Comparison: X-ray right hip 04/26/2013.

CLINICAL DATA: Question mild degenerative changes of the
acromioclavicular joint.

EXAM:
DG HIP (WITH OR WITHOUT PELVIS) 2-3V RIGHT

[pelvis ap]
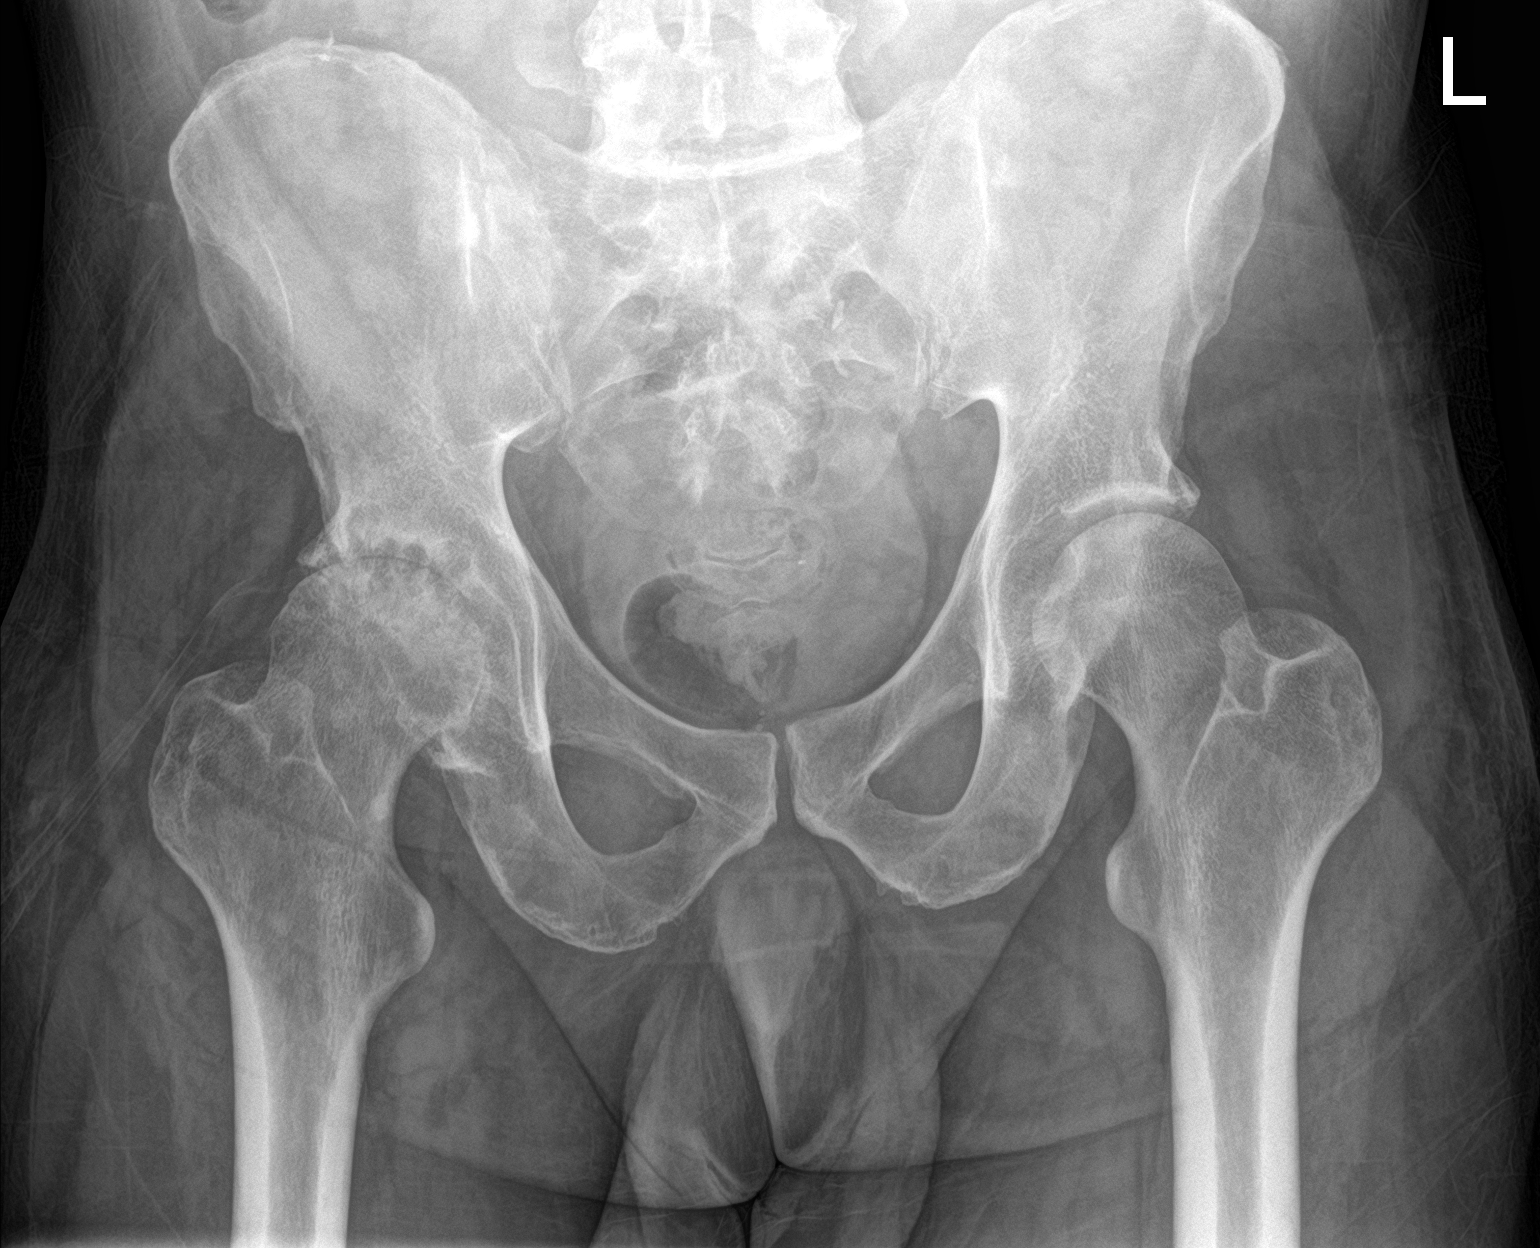

[hip ap]
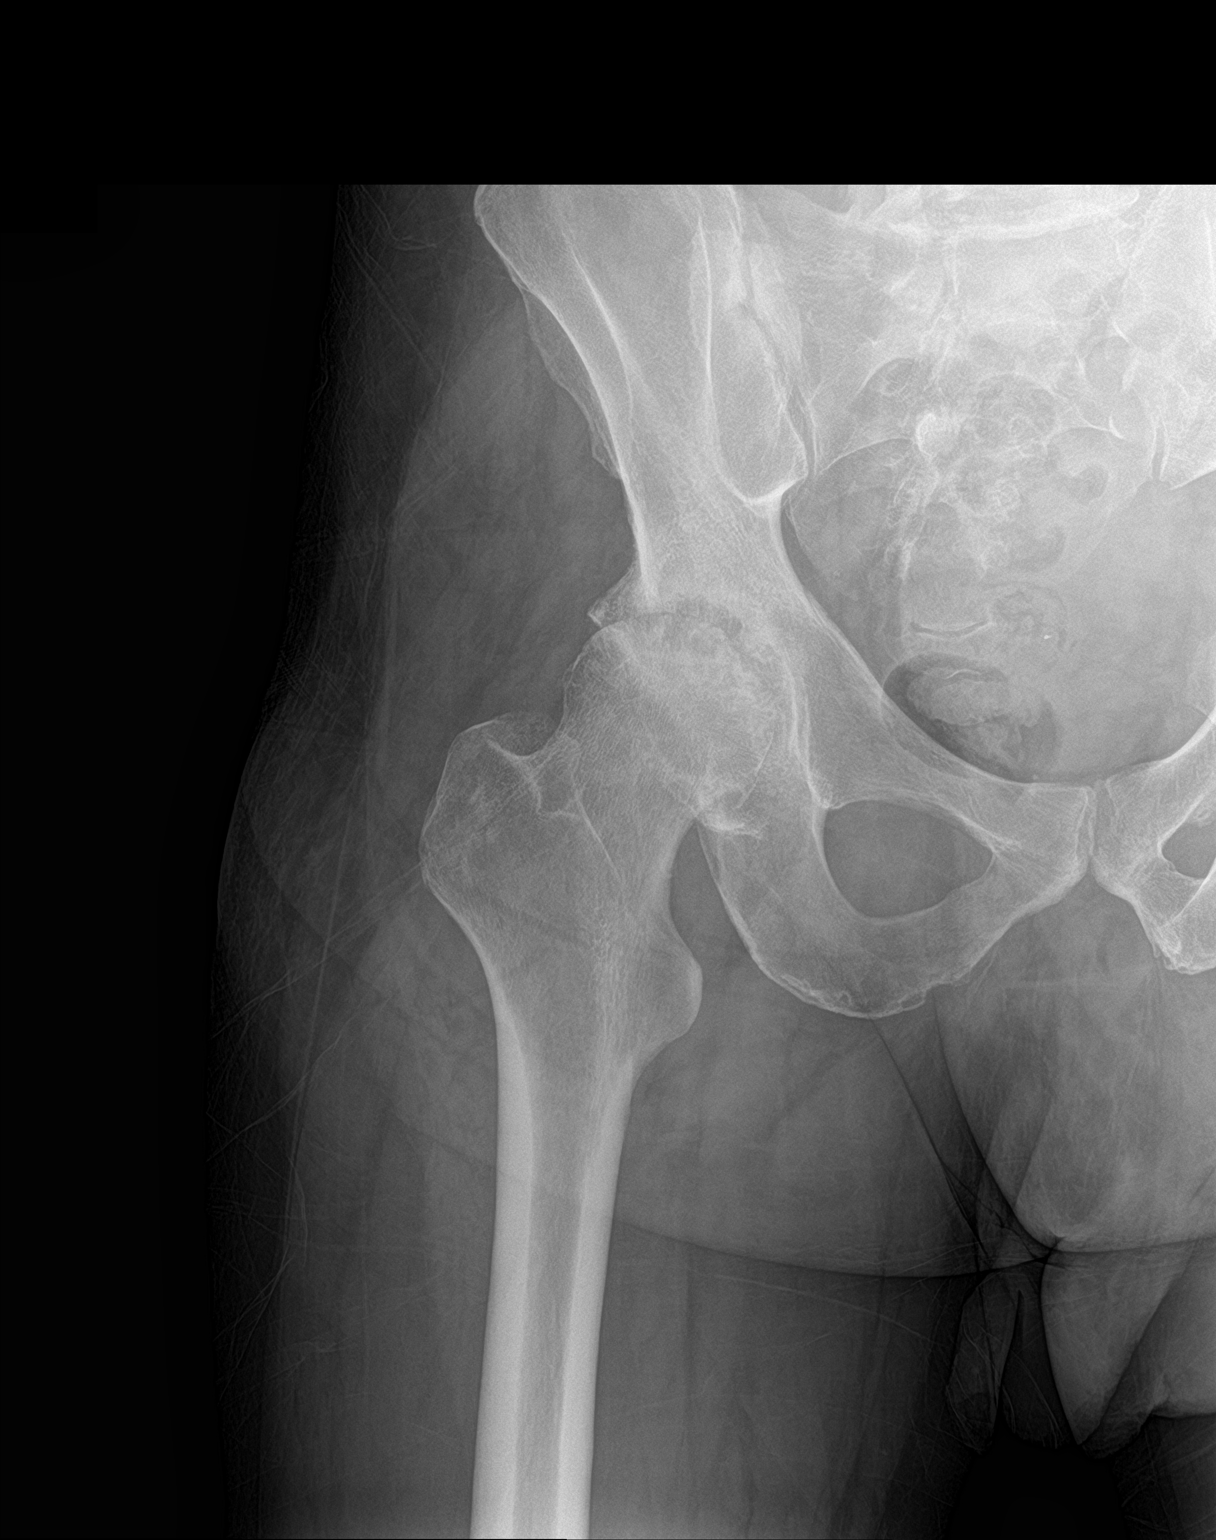

[hip lat]
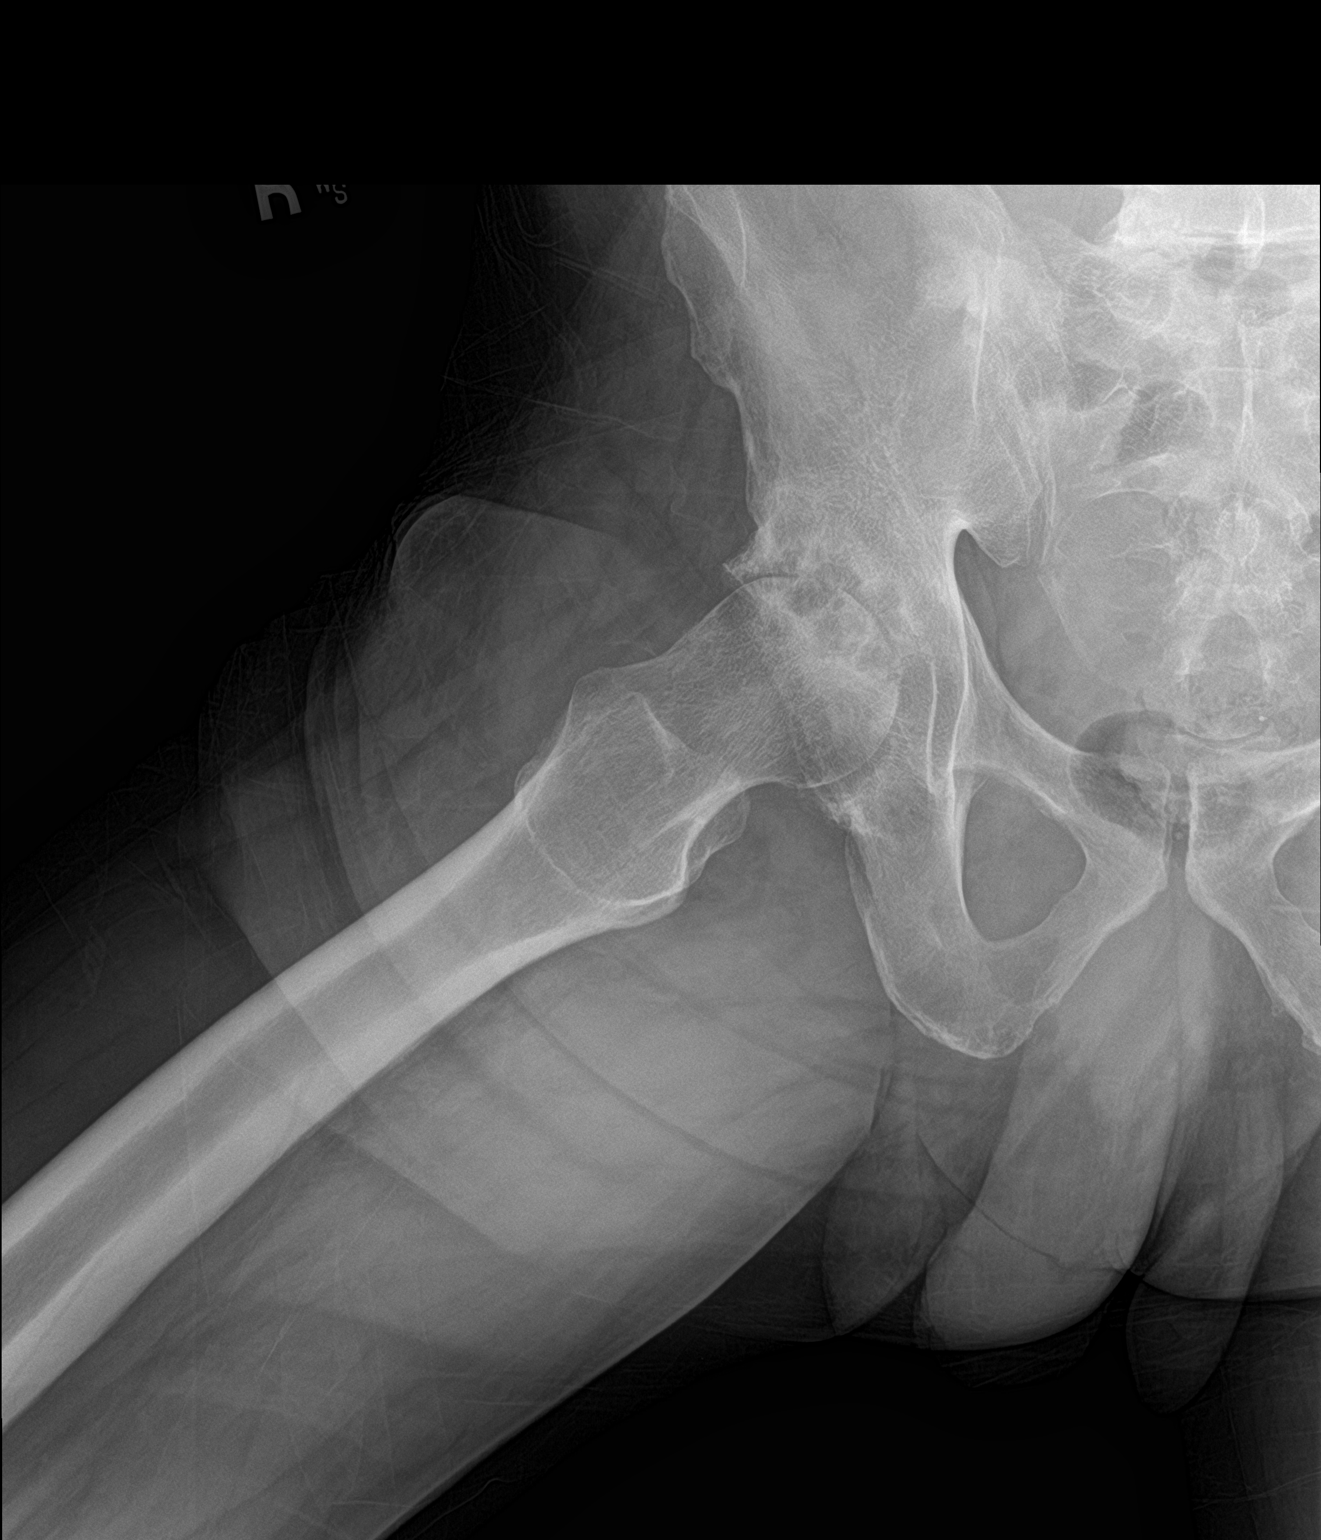

[3 of 3 positions shown; findings below may reference images not displayed]

FINDINGS: There is no evidence of hip fracture or dislocation. No acute
displaced fracture or diastasis of the bones of the pelvis. Severe
right hip degenerative changes. No aggressive appearing focal bone
abnormality.
IMPRESSION: 1.  No acute displaced fracture or dislocation.
2. Severe right hip degenerative changes.
